# Patient Record
Sex: Female | Born: 1965 | Hispanic: Yes | Marital: Single | Smoking: Never smoker
Health system: Southern US, Community
[De-identification: ages and names within clinical notes are randomized; demographics above are authoritative.]

## PROBLEM LIST (undated history)

## (undated) DIAGNOSIS — IMO0002 Reserved for concepts with insufficient information to code with codable children: Secondary | ICD-10-CM

## (undated) DIAGNOSIS — N289 Disorder of kidney and ureter, unspecified: Secondary | ICD-10-CM

## (undated) DIAGNOSIS — N938 Other specified abnormal uterine and vaginal bleeding: Secondary | ICD-10-CM

## (undated) HISTORY — DX: Reserved for concepts with insufficient information to code with codable children: IMO0002

## (undated) HISTORY — PX: ENDOMETRIAL ABLATION: SHX621

## (undated) HISTORY — DX: Other specified abnormal uterine and vaginal bleeding: N93.8

---

## 2006-03-22 ENCOUNTER — Ambulatory Visit: Payer: Self-pay | Admitting: Family Medicine

## 2006-03-22 ENCOUNTER — Ambulatory Visit: Payer: Self-pay | Admitting: *Deleted

## 2006-03-25 ENCOUNTER — Ambulatory Visit (HOSPITAL_COMMUNITY): Admission: RE | Admit: 2006-03-25 | Discharge: 2006-03-25 | Payer: Self-pay | Admitting: Internal Medicine

## 2006-03-29 ENCOUNTER — Ambulatory Visit: Payer: Self-pay | Admitting: Family Medicine

## 2006-04-04 ENCOUNTER — Ambulatory Visit: Payer: Self-pay | Admitting: Family Medicine

## 2006-04-25 ENCOUNTER — Ambulatory Visit: Payer: Self-pay | Admitting: Family Medicine

## 2006-05-16 ENCOUNTER — Ambulatory Visit: Payer: Self-pay | Admitting: Family Medicine

## 2006-06-17 ENCOUNTER — Ambulatory Visit: Payer: Self-pay | Admitting: Family Medicine

## 2006-08-19 ENCOUNTER — Encounter (INDEPENDENT_AMBULATORY_CARE_PROVIDER_SITE_OTHER): Payer: Self-pay | Admitting: Specialist

## 2006-08-19 ENCOUNTER — Ambulatory Visit: Payer: Self-pay | Admitting: Family Medicine

## 2006-10-26 ENCOUNTER — Ambulatory Visit: Payer: Self-pay | Admitting: Obstetrics and Gynecology

## 2006-12-05 ENCOUNTER — Ambulatory Visit: Payer: Self-pay | Admitting: Family Medicine

## 2007-03-06 ENCOUNTER — Ambulatory Visit: Payer: Self-pay | Admitting: Family Medicine

## 2007-03-14 ENCOUNTER — Ambulatory Visit (HOSPITAL_COMMUNITY): Admission: RE | Admit: 2007-03-14 | Discharge: 2007-03-14 | Payer: Self-pay | Admitting: Internal Medicine

## 2007-03-27 ENCOUNTER — Ambulatory Visit: Payer: Self-pay | Admitting: Family Medicine

## 2007-05-15 ENCOUNTER — Ambulatory Visit: Payer: Self-pay | Admitting: Family Medicine

## 2007-07-17 ENCOUNTER — Ambulatory Visit: Payer: Self-pay | Admitting: Internal Medicine

## 2007-08-09 ENCOUNTER — Encounter (INDEPENDENT_AMBULATORY_CARE_PROVIDER_SITE_OTHER): Payer: Self-pay | Admitting: *Deleted

## 2007-10-23 ENCOUNTER — Ambulatory Visit: Payer: Self-pay | Admitting: Internal Medicine

## 2007-10-23 ENCOUNTER — Encounter (INDEPENDENT_AMBULATORY_CARE_PROVIDER_SITE_OTHER): Payer: Self-pay | Admitting: Family Medicine

## 2007-10-23 LAB — CONVERTED CEMR LAB
Albumin: 3.6 g/dL (ref 3.5–5.2)
Alkaline Phosphatase: 92 units/L (ref 39–117)
BUN: 13 mg/dL (ref 6–23)
CO2: 26 meq/L (ref 19–32)
Calcium: 8.9 mg/dL (ref 8.4–10.5)
Chloride: 104 meq/L (ref 96–112)
Eosinophils Absolute: 0.1 10*3/uL — ABNORMAL LOW (ref 0.2–0.7)
Glucose, Bld: 106 mg/dL — ABNORMAL HIGH (ref 70–99)
HDL: 67 mg/dL (ref >39)
LDL Cholesterol: 103 mg/dL — ABNORMAL HIGH (ref 0–99)
Lymphocytes Relative: 18 % (ref 12–46)
Lymphs Abs: 2.2 10*3/uL (ref 0.7–4.0)
MCV: 83.8 fL (ref 78.0–100.0)
Monocytes Relative: 6 % (ref 3–12)
Neutro Abs: 9.6 10*3/uL — ABNORMAL HIGH (ref 1.7–7.7)
Neutrophils Relative %: 76 % (ref 43–77)
Platelets: 730 10*3/uL — ABNORMAL HIGH (ref 150–400)
Potassium: 4.3 meq/L (ref 3.5–5.3)
RBC: 4.27 M/uL (ref 3.87–5.11)
Sodium: 141 meq/L (ref 135–145)
Total Protein: 7.7 g/dL (ref 6.0–8.3)
Triglycerides: 160 mg/dL — ABNORMAL HIGH (ref <150)
WBC: 12.7 10*3/uL — ABNORMAL HIGH (ref 4.0–10.5)

## 2007-11-01 ENCOUNTER — Inpatient Hospital Stay (HOSPITAL_COMMUNITY): Admission: AD | Admit: 2007-11-01 | Discharge: 2007-11-01 | Payer: Self-pay | Admitting: Obstetrics & Gynecology

## 2007-11-23 ENCOUNTER — Inpatient Hospital Stay (HOSPITAL_COMMUNITY): Admission: AD | Admit: 2007-11-23 | Discharge: 2007-11-23 | Payer: Self-pay | Admitting: Obstetrics & Gynecology

## 2008-02-02 ENCOUNTER — Encounter (INDEPENDENT_AMBULATORY_CARE_PROVIDER_SITE_OTHER): Payer: Self-pay | Admitting: Family Medicine

## 2008-02-02 ENCOUNTER — Ambulatory Visit: Payer: Self-pay | Admitting: Internal Medicine

## 2008-02-02 LAB — CONVERTED CEMR LAB
Basophils Absolute: 0 10*3/uL (ref 0.0–0.1)
Hemoglobin: 10.8 g/dL — ABNORMAL LOW (ref 12.0–15.0)
Lymphocytes Relative: 24 % (ref 12–46)
Lymphs Abs: 2 10*3/uL (ref 0.7–4.0)
Monocytes Absolute: 0.4 10*3/uL (ref 0.1–1.0)
Neutro Abs: 6 10*3/uL (ref 1.7–7.7)
Platelets: 677 10*3/uL — ABNORMAL HIGH (ref 150–400)
RDW: 18.7 % — ABNORMAL HIGH (ref 11.5–15.5)
Saturation Ratios: 13 % — ABNORMAL LOW (ref 20–55)
TIBC: 367 ug/dL (ref 250–470)
WBC: 8.5 10*3/uL (ref 4.0–10.5)

## 2008-09-02 ENCOUNTER — Ambulatory Visit: Payer: Self-pay | Admitting: Internal Medicine

## 2008-10-01 ENCOUNTER — Ambulatory Visit: Payer: Self-pay | Admitting: Internal Medicine

## 2008-10-01 ENCOUNTER — Encounter: Payer: Self-pay | Admitting: Family Medicine

## 2008-10-01 LAB — CONVERTED CEMR LAB
ALT: 16 units/L (ref 0–35)
BUN: 8 mg/dL (ref 6–23)
CO2: 24 meq/L (ref 19–32)
Calcium: 9.1 mg/dL (ref 8.4–10.5)
Chloride: 106 meq/L (ref 96–112)
Cholesterol: 190 mg/dL (ref 0–200)
Creatinine, Ser: 0.47 mg/dL (ref 0.40–1.20)
Eosinophils Relative: 0 % (ref 0–5)
Glucose, Bld: 73 mg/dL (ref 70–99)
HCT: 32 % — ABNORMAL LOW (ref 36.0–46.0)
HDL: 62 mg/dL (ref >39)
Hemoglobin: 9.3 g/dL — ABNORMAL LOW (ref 12.0–15.0)
Lymphocytes Relative: 29 % (ref 12–46)
Lymphs Abs: 2.5 10*3/uL (ref 0.7–4.0)
MCV: 78.2 fL (ref 78.0–100.0)
Monocytes Absolute: 0.6 10*3/uL (ref 0.1–1.0)
Monocytes Relative: 7 % (ref 3–12)
Total CHOL/HDL Ratio: 3.1
Triglycerides: 92 mg/dL (ref <150)
WBC: 8.6 10*3/uL (ref 4.0–10.5)

## 2008-10-21 ENCOUNTER — Encounter: Payer: Self-pay | Admitting: Family Medicine

## 2008-10-21 ENCOUNTER — Ambulatory Visit: Payer: Self-pay | Admitting: Family Medicine

## 2008-10-21 LAB — CONVERTED CEMR LAB: GC Probe Amp, Genital: NEGATIVE

## 2008-10-24 ENCOUNTER — Ambulatory Visit: Payer: Self-pay | Admitting: Internal Medicine

## 2008-10-25 ENCOUNTER — Inpatient Hospital Stay (HOSPITAL_COMMUNITY): Admission: EM | Admit: 2008-10-25 | Discharge: 2008-10-29 | Payer: Self-pay | Admitting: *Deleted

## 2008-10-25 ENCOUNTER — Ambulatory Visit: Payer: Self-pay | Admitting: Infectious Diseases

## 2008-12-02 ENCOUNTER — Ambulatory Visit: Payer: Self-pay | Admitting: Internal Medicine

## 2008-12-22 ENCOUNTER — Observation Stay (HOSPITAL_COMMUNITY): Admission: EM | Admit: 2008-12-22 | Discharge: 2008-12-24 | Payer: Self-pay | Admitting: Internal Medicine

## 2008-12-22 ENCOUNTER — Ambulatory Visit: Payer: Self-pay | Admitting: Cardiology

## 2008-12-22 ENCOUNTER — Encounter (INDEPENDENT_AMBULATORY_CARE_PROVIDER_SITE_OTHER): Payer: Self-pay | Admitting: Internal Medicine

## 2008-12-23 ENCOUNTER — Encounter (INDEPENDENT_AMBULATORY_CARE_PROVIDER_SITE_OTHER): Payer: Self-pay | Admitting: Internal Medicine

## 2009-01-02 ENCOUNTER — Other Ambulatory Visit: Admission: RE | Admit: 2009-01-02 | Discharge: 2009-01-02 | Payer: Self-pay | Admitting: Obstetrics & Gynecology

## 2009-01-02 ENCOUNTER — Ambulatory Visit: Payer: Self-pay | Admitting: Internal Medicine

## 2009-01-02 ENCOUNTER — Ambulatory Visit: Payer: Self-pay | Admitting: Obstetrics & Gynecology

## 2009-01-02 ENCOUNTER — Encounter: Payer: Self-pay | Admitting: Family Medicine

## 2009-01-02 LAB — CONVERTED CEMR LAB
AST: 9 units/L (ref 0–37)
BUN: 8 mg/dL (ref 6–23)
Basophils Absolute: 0 10*3/uL (ref 0.0–0.1)
CO2: 24 meq/L (ref 19–32)
CRP: 8.1 mg/dL — ABNORMAL HIGH (ref <0.6)
Calcium: 9.3 mg/dL (ref 8.4–10.5)
Chloride: 103 meq/L (ref 96–112)
Creatinine, Ser: 0.46 mg/dL (ref 0.40–1.20)
Eosinophils Relative: 2 % (ref 0–5)
HCT: 36.8 % (ref 36.0–46.0)
Hemoglobin: 11.4 g/dL — ABNORMAL LOW (ref 12.0–15.0)
Lymphocytes Relative: 28 % (ref 12–46)
Monocytes Absolute: 0.7 10*3/uL (ref 0.1–1.0)
RDW: 21.5 % — ABNORMAL HIGH (ref 11.5–15.5)

## 2009-01-13 ENCOUNTER — Ambulatory Visit: Payer: Self-pay | Admitting: Internal Medicine

## 2009-01-15 ENCOUNTER — Ambulatory Visit: Payer: Self-pay | Admitting: Obstetrics & Gynecology

## 2009-01-21 ENCOUNTER — Ambulatory Visit (HOSPITAL_COMMUNITY): Admission: RE | Admit: 2009-01-21 | Discharge: 2009-01-21 | Payer: Self-pay | Admitting: Family Medicine

## 2009-01-30 ENCOUNTER — Encounter: Payer: Self-pay | Admitting: Obstetrics & Gynecology

## 2009-01-30 ENCOUNTER — Ambulatory Visit: Payer: Self-pay | Admitting: Family Medicine

## 2009-01-30 LAB — CONVERTED CEMR LAB: GC Probe Amp, Urine: NEGATIVE

## 2009-03-07 ENCOUNTER — Ambulatory Visit: Payer: Self-pay | Admitting: Obstetrics & Gynecology

## 2009-03-07 LAB — CONVERTED CEMR LAB
Platelets: 605 10*3/uL — ABNORMAL HIGH (ref 150–400)
WBC: 15.2 10*3/uL — ABNORMAL HIGH (ref 4.0–10.5)

## 2009-03-20 ENCOUNTER — Ambulatory Visit (HOSPITAL_COMMUNITY): Admission: RE | Admit: 2009-03-20 | Discharge: 2009-03-20 | Payer: Self-pay | Admitting: Obstetrics & Gynecology

## 2009-03-21 ENCOUNTER — Encounter: Admission: RE | Admit: 2009-03-21 | Discharge: 2009-03-21 | Payer: Self-pay | Admitting: Family Medicine

## 2009-03-31 ENCOUNTER — Ambulatory Visit: Payer: Self-pay | Admitting: Internal Medicine

## 2009-03-31 ENCOUNTER — Encounter: Payer: Self-pay | Admitting: Internal Medicine

## 2009-03-31 DIAGNOSIS — M069 Rheumatoid arthritis, unspecified: Secondary | ICD-10-CM | POA: Insufficient documentation

## 2009-03-31 DIAGNOSIS — D649 Anemia, unspecified: Secondary | ICD-10-CM | POA: Insufficient documentation

## 2009-03-31 DIAGNOSIS — B029 Zoster without complications: Secondary | ICD-10-CM | POA: Insufficient documentation

## 2009-04-07 ENCOUNTER — Ambulatory Visit: Payer: Self-pay | Admitting: Internal Medicine

## 2009-05-06 ENCOUNTER — Ambulatory Visit: Payer: Self-pay | Admitting: Internal Medicine

## 2009-07-14 ENCOUNTER — Ambulatory Visit: Payer: Self-pay | Admitting: Internal Medicine

## 2009-08-04 ENCOUNTER — Ambulatory Visit: Payer: Self-pay | Admitting: Internal Medicine

## 2009-08-04 ENCOUNTER — Encounter: Payer: Self-pay | Admitting: Family Medicine

## 2009-08-04 LAB — CONVERTED CEMR LAB
AST: 12 units/L (ref 0–37)
Albumin: 3.3 g/dL — ABNORMAL LOW (ref 3.5–5.2)
BUN: 11 mg/dL (ref 6–23)
Calcium: 8.8 mg/dL (ref 8.4–10.5)
Chloride: 104 meq/L (ref 96–112)
Eosinophils Relative: 1 % (ref 0–5)
Glucose, Bld: 67 mg/dL — ABNORMAL LOW (ref 70–99)
HCT: 39.2 % (ref 36.0–46.0)
Hemoglobin: 11.6 g/dL — ABNORMAL LOW (ref 12.0–15.0)
Lymphocytes Relative: 16 % (ref 12–46)
Lymphs Abs: 2.5 10*3/uL (ref 0.7–4.0)
Monocytes Absolute: 0.9 10*3/uL (ref 0.1–1.0)
Potassium: 4.6 meq/L (ref 3.5–5.3)
RBC: 4.36 M/uL (ref 3.87–5.11)
Total Protein: 7.2 g/dL (ref 6.0–8.3)
WBC: 16.3 10*3/uL — ABNORMAL HIGH (ref 4.0–10.5)

## 2009-08-07 ENCOUNTER — Ambulatory Visit: Payer: Self-pay | Admitting: Obstetrics and Gynecology

## 2009-08-07 ENCOUNTER — Encounter: Payer: Self-pay | Admitting: Obstetrics and Gynecology

## 2009-08-11 ENCOUNTER — Ambulatory Visit: Payer: Self-pay | Admitting: Internal Medicine

## 2009-08-26 ENCOUNTER — Encounter: Admission: RE | Admit: 2009-08-26 | Discharge: 2009-08-26 | Payer: Self-pay | Admitting: Obstetrics & Gynecology

## 2009-10-24 ENCOUNTER — Ambulatory Visit: Payer: Self-pay | Admitting: Internal Medicine

## 2009-12-19 IMAGING — CR DG CHEST 2V
2 series · 2 of 2 positions shown · non-contrast
Comparison: None

CLINICAL DATA: Fever.

CHEST - 2 VIEW

[w chest pa]
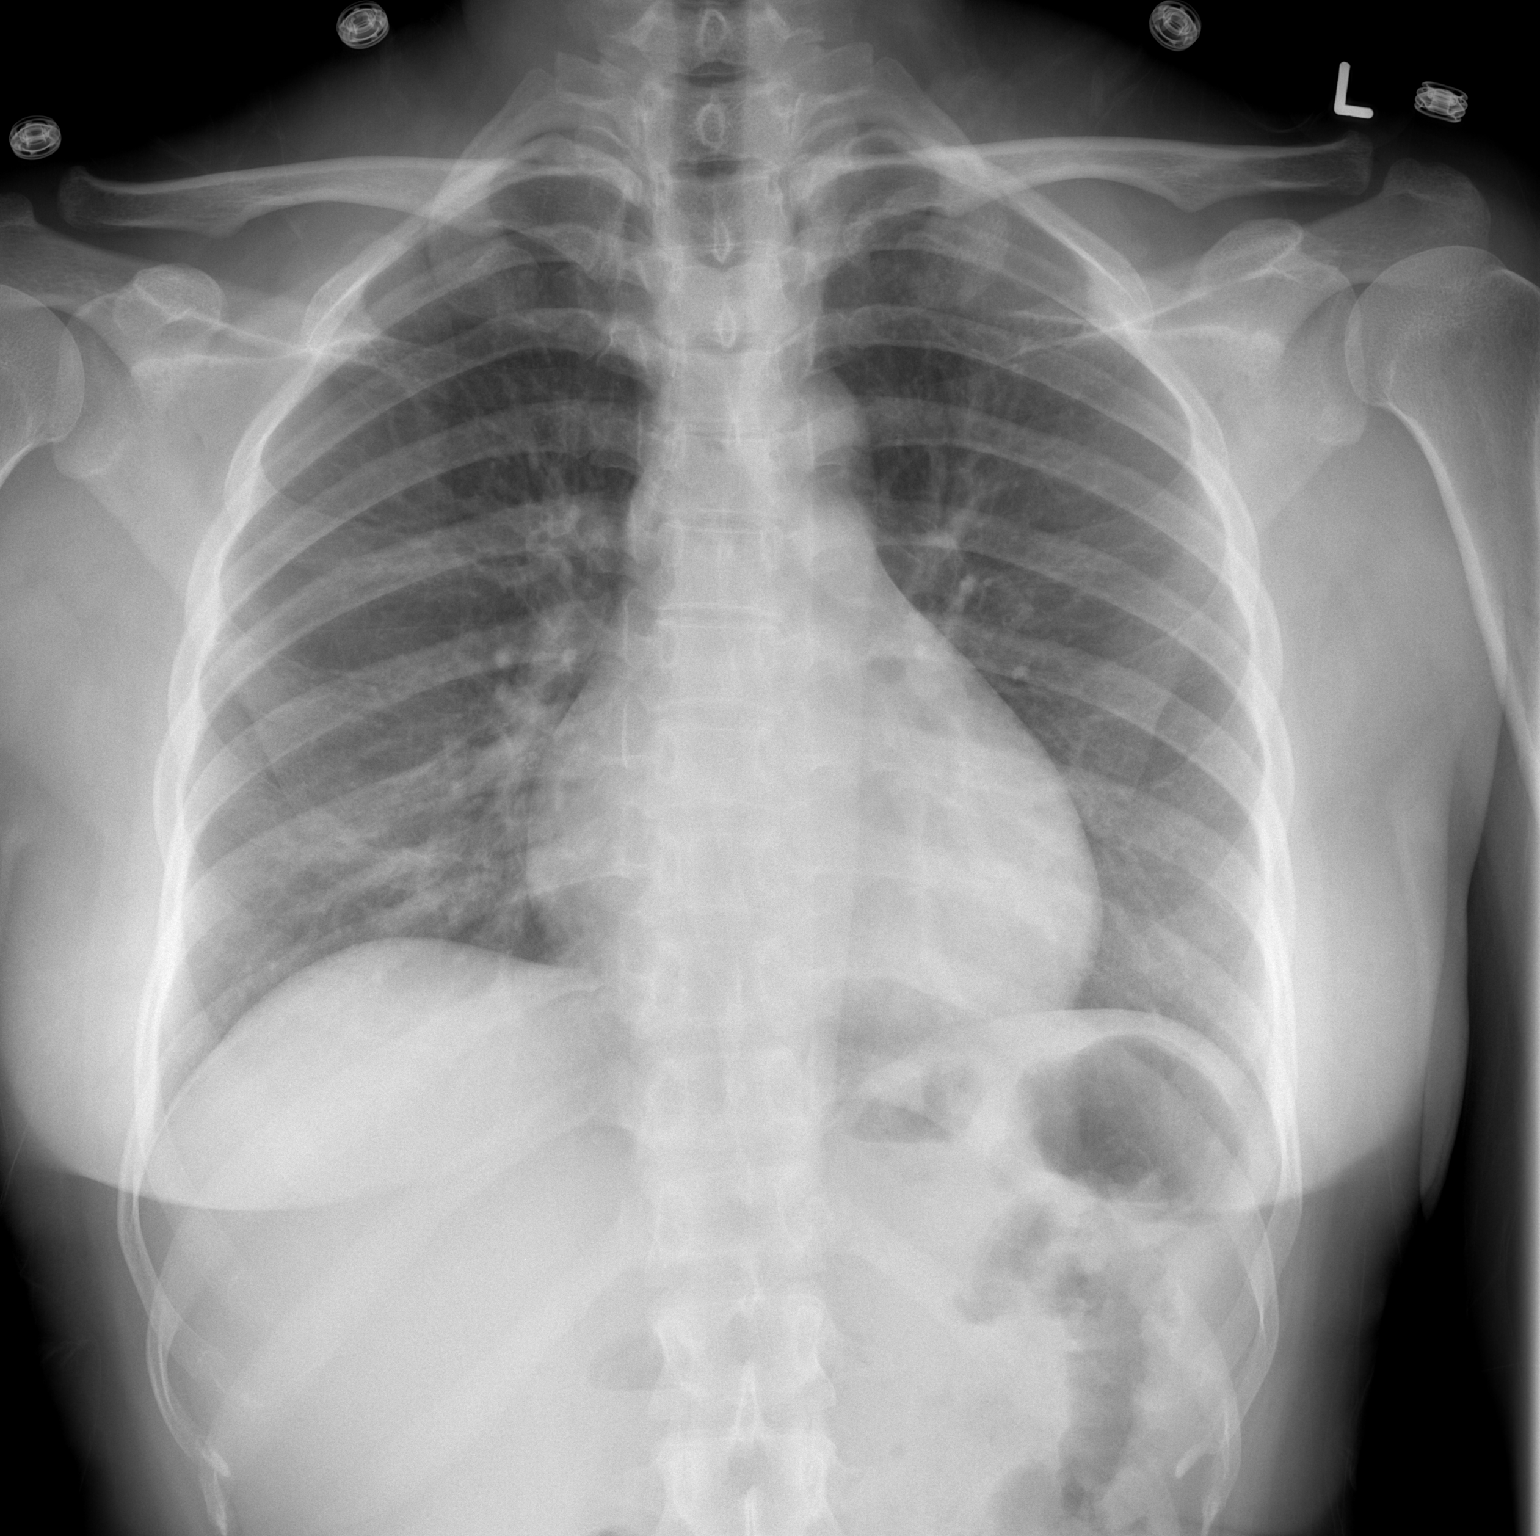

[w chest lat]
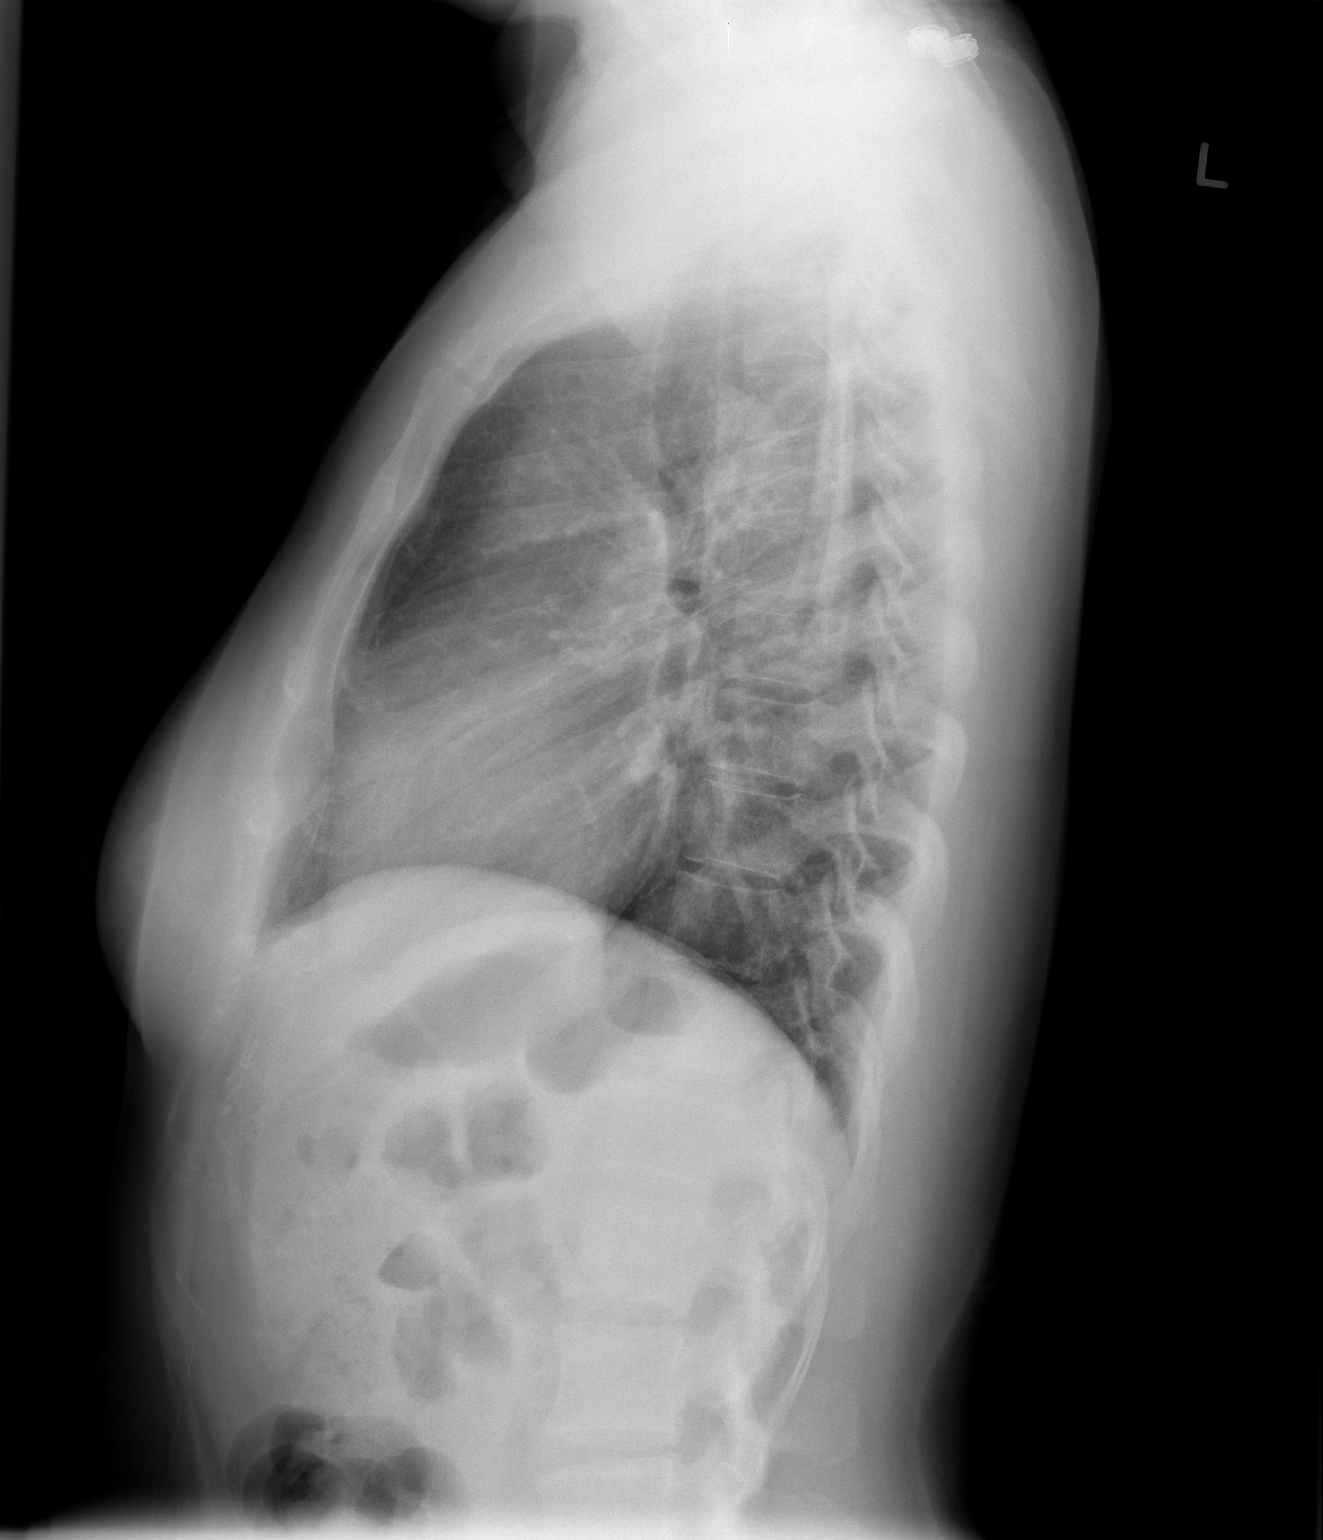

[2 of 2 positions shown; findings below may reference images not displayed]

FINDINGS: Midline trachea. Heart size upper limits of normal and
accentuated by mildly low lung volumes.  Otherwise normal
mediastinal contours. No pleural effusion or pneumothorax. Vague
increased density over the lung bases greater on the right than
left felt to be due to overlying soft tissues.  Lungs otherwise
clear.
IMPRESSION: No acute cardiopulmonary disease.

Borderline cardiomegaly accentuated by low lung volumes.

## 2010-01-01 ENCOUNTER — Ambulatory Visit: Payer: Self-pay | Admitting: Obstetrics and Gynecology

## 2010-01-01 ENCOUNTER — Other Ambulatory Visit: Admission: RE | Admit: 2010-01-01 | Discharge: 2010-01-01 | Payer: Self-pay | Admitting: Obstetrics & Gynecology

## 2010-01-01 ENCOUNTER — Encounter: Payer: Self-pay | Admitting: Obstetrics & Gynecology

## 2010-01-01 LAB — CONVERTED CEMR LAB
MCHC: 30.4 g/dL (ref 30.0–36.0)
Platelets: 763 10*3/uL — ABNORMAL HIGH (ref 150–400)
RBC: 3.74 M/uL — ABNORMAL LOW (ref 3.87–5.11)

## 2010-01-15 ENCOUNTER — Ambulatory Visit: Payer: Self-pay | Admitting: Obstetrics and Gynecology

## 2010-01-23 ENCOUNTER — Ambulatory Visit: Payer: Self-pay | Admitting: Internal Medicine

## 2010-02-23 ENCOUNTER — Ambulatory Visit: Payer: Self-pay | Admitting: Internal Medicine

## 2010-02-23 LAB — CONVERTED CEMR LAB
Basophils Absolute: 0 10*3/uL (ref 0.0–0.1)
Basophils Relative: 0 % (ref 0–1)
Eosinophils Absolute: 0.1 10*3/uL (ref 0.0–0.7)
Eosinophils Relative: 1 % (ref 0–5)
Iron: 39 ug/dL — ABNORMAL LOW (ref 42–145)
Lymphs Abs: 3.2 10*3/uL (ref 0.7–4.0)
MCHC: 30.7 g/dL (ref 30.0–36.0)
MCV: 92.5 fL (ref 78.0–100.0)
Neutrophils Relative %: 69 % (ref 43–77)
Platelets: 618 10*3/uL — ABNORMAL HIGH (ref 150–400)
RDW: 15 % (ref 11.5–15.5)
UIBC: 373 ug/dL
WBC: 12.9 10*3/uL — ABNORMAL HIGH (ref 4.0–10.5)

## 2010-03-16 ENCOUNTER — Ambulatory Visit: Payer: Self-pay | Admitting: Internal Medicine

## 2010-03-31 ENCOUNTER — Ambulatory Visit: Payer: Self-pay | Admitting: Internal Medicine

## 2010-04-27 ENCOUNTER — Ambulatory Visit: Payer: Self-pay | Admitting: Internal Medicine

## 2010-04-27 LAB — CONVERTED CEMR LAB: CRP: 1 mg/dL — ABNORMAL HIGH (ref <0.6)

## 2010-04-28 ENCOUNTER — Telehealth (INDEPENDENT_AMBULATORY_CARE_PROVIDER_SITE_OTHER): Payer: Self-pay | Admitting: *Deleted

## 2010-04-28 ENCOUNTER — Ambulatory Visit: Payer: Self-pay | Admitting: Internal Medicine

## 2010-04-30 ENCOUNTER — Encounter: Admission: RE | Admit: 2010-04-30 | Discharge: 2010-04-30 | Payer: Self-pay | Admitting: Internal Medicine

## 2010-05-01 ENCOUNTER — Ambulatory Visit: Payer: Self-pay | Admitting: Internal Medicine

## 2010-05-01 LAB — CONVERTED CEMR LAB
ALT: 13 units/L (ref 0–35)
AST: 13 units/L (ref 0–37)
Albumin: 3.3 g/dL — ABNORMAL LOW (ref 3.5–5.2)
BUN: 12 mg/dL (ref 6–23)
CO2: 25 meq/L (ref 19–32)
Calcium: 8.7 mg/dL (ref 8.4–10.5)
Chloride: 107 meq/L (ref 96–112)
Creatinine, Ser: 0.57 mg/dL (ref 0.40–1.20)
Eosinophils Absolute: 0.1 10*3/uL (ref 0.0–0.7)
Eosinophils Relative: 1 % (ref 0–5)
HCT: 35.1 % — ABNORMAL LOW (ref 36.0–46.0)
Hemoglobin: 10.2 g/dL — ABNORMAL LOW (ref 12.0–15.0)
Lymphocytes Relative: 27 % (ref 12–46)
Lymphs Abs: 3 10*3/uL (ref 0.7–4.0)
MCV: 91.9 fL (ref 78.0–100.0)
Monocytes Relative: 6 % (ref 3–12)
Potassium: 4.5 meq/L (ref 3.5–5.3)
RBC: 3.82 M/uL — ABNORMAL LOW (ref 3.87–5.11)
WBC: 10.9 10*3/uL — ABNORMAL HIGH (ref 4.0–10.5)

## 2010-05-06 ENCOUNTER — Ambulatory Visit: Payer: Self-pay | Admitting: Internal Medicine

## 2010-05-06 LAB — CONVERTED CEMR LAB
ALT: 17 units/L (ref 0–35)
AST: 22 units/L (ref 0–37)
Alkaline Phosphatase: 104 units/L (ref 39–117)
Anti Nuclear Antibody(ANA): NEGATIVE
BUN: 7 mg/dL (ref 6–23)
Chloride: 103 meq/L (ref 96–112)
Creatinine, Ser: 0.55 mg/dL (ref 0.40–1.20)
Sed Rate: 94 mm/hr — ABNORMAL HIGH (ref 0–22)

## 2010-05-15 ENCOUNTER — Ambulatory Visit: Payer: Self-pay | Admitting: Internal Medicine

## 2010-05-15 LAB — CONVERTED CEMR LAB
CRP: 14.1 mg/dL — ABNORMAL HIGH (ref <0.6)
FSH: 17.1 milliintl units/mL

## 2010-05-19 ENCOUNTER — Ambulatory Visit: Payer: Self-pay | Admitting: Internal Medicine

## 2010-06-08 ENCOUNTER — Ambulatory Visit: Payer: Self-pay | Admitting: Internal Medicine

## 2010-06-08 LAB — CONVERTED CEMR LAB
CRP: 2.8 mg/dL — ABNORMAL HIGH (ref <0.6)
Rhuematoid fact SerPl-aCnc: <20 intl units/mL (ref 0–20)

## 2010-07-06 ENCOUNTER — Ambulatory Visit: Payer: Self-pay | Admitting: Internal Medicine

## 2010-07-06 LAB — CONVERTED CEMR LAB
Basophils Absolute: 0 10*3/uL (ref 0.0–0.1)
Basophils Relative: 0 % (ref 0–1)
Eosinophils Absolute: 0.1 10*3/uL (ref 0.0–0.7)
Eosinophils Relative: 1 % (ref 0–5)
HCT: 23.4 % — ABNORMAL LOW (ref 36.0–46.0)
Hemoglobin: 6.8 g/dL — CL (ref 12.0–15.0)
Iron: 11 ug/dL — ABNORMAL LOW (ref 42–145)
Lymphocytes Relative: 32 % (ref 12–46)
MCHC: 29.1 g/dL — ABNORMAL LOW (ref 30.0–36.0)
MCV: 81.8 fL (ref 78.0–100.0)
Monocytes Absolute: 0.7 10*3/uL (ref 0.1–1.0)
Platelets: 661 10*3/uL — ABNORMAL HIGH (ref 150–400)
RDW: 15.1 % (ref 11.5–15.5)
Vitamin B-12: 273 pg/mL (ref 211–911)

## 2010-07-07 ENCOUNTER — Ambulatory Visit: Payer: Self-pay | Admitting: Internal Medicine

## 2010-07-09 ENCOUNTER — Ambulatory Visit: Payer: Self-pay | Admitting: Obstetrics and Gynecology

## 2010-07-09 LAB — CONVERTED CEMR LAB: Pap Smear: NEGATIVE

## 2010-08-10 ENCOUNTER — Inpatient Hospital Stay (HOSPITAL_COMMUNITY): Admission: AD | Admit: 2010-08-10 | Discharge: 2010-08-10 | Payer: Self-pay | Admitting: Obstetrics & Gynecology

## 2010-08-10 ENCOUNTER — Ambulatory Visit: Payer: Self-pay | Admitting: Nurse Practitioner

## 2010-08-19 ENCOUNTER — Ambulatory Visit: Payer: Self-pay | Admitting: Obstetrics and Gynecology

## 2010-08-25 ENCOUNTER — Emergency Department (HOSPITAL_COMMUNITY)
Admission: EM | Admit: 2010-08-25 | Discharge: 2010-08-25 | Payer: Self-pay | Source: Home / Self Care | Admitting: Emergency Medicine

## 2010-10-21 ENCOUNTER — Ambulatory Visit (HOSPITAL_COMMUNITY): Admission: RE | Admit: 2010-10-21 | Discharge: 2010-10-21 | Payer: Self-pay | Admitting: Obstetrics and Gynecology

## 2010-11-18 ENCOUNTER — Ambulatory Visit: Payer: Self-pay | Admitting: Obstetrics and Gynecology

## 2010-12-02 ENCOUNTER — Ambulatory Visit
Admission: RE | Admit: 2010-12-02 | Discharge: 2010-12-02 | Payer: Self-pay | Source: Home / Self Care | Attending: Obstetrics and Gynecology | Admitting: Obstetrics and Gynecology

## 2010-12-13 ENCOUNTER — Encounter: Payer: Self-pay | Admitting: *Deleted

## 2010-12-22 NOTE — Progress Notes (Signed)
Summary: acute/bruising  Phone Note Call from Patient   Caller: Patient Reason for Call: Talk to Nurse Summary of Call: Patient was in to see [facility] today...and complaining of bruising at site of blood drawn for labwork. Patient also had purple bruising on outer left upper thigh.Marland KitchenMarland KitchenPatient states she bruises easily..Patient denies any physical abuse...she has a history of anemia..Patient has appointment with Dr. Pamalee Leyden on 05/15/10 and will be addressed then.Marland KitchenMarland KitchenShe denies any blood in stool or bleeding nose or gums.Marland KitchenMarland KitchenPatinet states understanding to call with any worsening s/s.. Initial call taken by: Conchita Paris,  April 28, 2010 2:53 PM

## 2010-12-23 ENCOUNTER — Encounter: Payer: Self-pay | Admitting: Obstetrics and Gynecology

## 2011-02-02 LAB — CBC
HCT: 32.6 % — ABNORMAL LOW (ref 36.0–46.0)
Hemoglobin: 10.2 g/dL — ABNORMAL LOW (ref 12.0–15.0)
MCHC: 31.2 g/dL (ref 30.0–36.0)
MCV: 71.2 fL — ABNORMAL LOW (ref 78.0–100.0)

## 2011-02-04 LAB — URINALYSIS, ROUTINE W REFLEX MICROSCOPIC
Bilirubin Urine: NEGATIVE
Glucose, UA: NEGATIVE mg/dL
Ketones, ur: NEGATIVE mg/dL
pH: 5.5 (ref 5.0–8.0)

## 2011-02-04 LAB — DIFFERENTIAL
Basophils Relative: 0 % (ref 0–1)
Eosinophils Relative: 0 % (ref 0–5)
Lymphs Abs: 2 10*3/uL (ref 0.7–4.0)
Monocytes Absolute: 0.7 10*3/uL (ref 0.1–1.0)

## 2011-02-04 LAB — POCT PREGNANCY, URINE
Preg Test, Ur: NEGATIVE
Preg Test, Ur: NEGATIVE

## 2011-02-04 LAB — CBC
Hemoglobin: 7.5 g/dL — ABNORMAL LOW (ref 12.0–15.0)
Hemoglobin: 7.8 g/dL — ABNORMAL LOW (ref 12.0–15.0)
MCH: 20.9 pg — ABNORMAL LOW (ref 26.0–34.0)
MCHC: 28.1 g/dL — ABNORMAL LOW (ref 30.0–36.0)
MCHC: 30.8 g/dL (ref 30.0–36.0)
MCV: 74.4 fL — ABNORMAL LOW (ref 78.0–100.0)
RBC: 3.52 MIL/uL — ABNORMAL LOW (ref 3.87–5.11)
RBC: 3.59 MIL/uL — ABNORMAL LOW (ref 3.87–5.11)
WBC: 29.4 10*3/uL — ABNORMAL HIGH (ref 4.0–10.5)

## 2011-02-04 LAB — BASIC METABOLIC PANEL
CO2: 27 mEq/L (ref 19–32)
Chloride: 103 mEq/L (ref 96–112)
Creatinine, Ser: 0.59 mg/dL (ref 0.4–1.2)
GFR calc Af Amer: >60 mL/min (ref >60)
Glucose, Bld: 101 mg/dL — ABNORMAL HIGH (ref 70–99)

## 2011-02-04 LAB — URINE MICROSCOPIC-ADD ON

## 2011-02-05 LAB — POCT PREGNANCY, URINE: Preg Test, Ur: NEGATIVE

## 2011-03-04 LAB — POCT PREGNANCY, URINE: Preg Test, Ur: NEGATIVE

## 2011-03-08 LAB — URINE MICROSCOPIC-ADD ON

## 2011-03-08 LAB — URINALYSIS, ROUTINE W REFLEX MICROSCOPIC
Glucose, UA: NEGATIVE mg/dL
Hgb urine dipstick: NEGATIVE
Ketones, ur: NEGATIVE mg/dL
Protein, ur: NEGATIVE mg/dL

## 2011-03-08 LAB — BASIC METABOLIC PANEL
CO2: 25 mEq/L (ref 19–32)
Calcium: 9.2 mg/dL (ref 8.4–10.5)
Creatinine, Ser: 0.55 mg/dL (ref 0.4–1.2)
GFR calc Af Amer: >60 mL/min (ref >60)

## 2011-03-08 LAB — DIFFERENTIAL
Basophils Absolute: 0 10*3/uL (ref 0.0–0.1)
Eosinophils Absolute: 0 10*3/uL (ref 0.0–0.7)
Lymphocytes Relative: 10 % — ABNORMAL LOW (ref 12–46)
Neutro Abs: 12.7 10*3/uL — ABNORMAL HIGH (ref 1.7–7.7)

## 2011-03-08 LAB — URINE CULTURE: Colony Count: >=100000

## 2011-03-08 LAB — PREGNANCY, URINE: Preg Test, Ur: NEGATIVE

## 2011-03-08 LAB — TSH: TSH: 0.871 u[IU]/mL (ref 0.350–4.500)

## 2011-03-08 LAB — POCT CARDIAC MARKERS
Myoglobin, poc: 20.9 ng/mL (ref 12–200)
Troponin i, poc: <0.05 ng/mL (ref 0.00–0.09)

## 2011-03-08 LAB — CK TOTAL AND CKMB (NOT AT ARMC)
CK, MB: 0.6 ng/mL (ref 0.3–4.0)
Relative Index: INVALID (ref 0.0–2.5)

## 2011-03-08 LAB — TROPONIN I
Troponin I: 0.01 ng/mL (ref 0.00–0.06)
Troponin I: <0.01 ng/mL (ref 0.00–0.06)
Troponin I: <0.01 ng/mL (ref 0.00–0.06)

## 2011-03-08 LAB — LIPID PANEL
LDL Cholesterol: 106 mg/dL — ABNORMAL HIGH (ref 0–99)
Triglycerides: 45 mg/dL (ref <150)

## 2011-03-08 LAB — CBC
MCHC: 32.1 g/dL (ref 30.0–36.0)
RBC: 4.95 MIL/uL (ref 3.87–5.11)

## 2011-03-09 LAB — BASIC METABOLIC PANEL
BUN: 7 mg/dL (ref 6–23)
Chloride: 103 mEq/L (ref 96–112)
GFR calc Af Amer: >60 mL/min (ref >60)
Potassium: 3.6 mEq/L (ref 3.5–5.1)
Sodium: 138 mEq/L (ref 135–145)

## 2011-03-09 LAB — CBC
HCT: 31.8 % — ABNORMAL LOW (ref 36.0–46.0)
Hemoglobin: 10.3 g/dL — ABNORMAL LOW (ref 12.0–15.0)
Hemoglobin: 10.6 g/dL — ABNORMAL LOW (ref 12.0–15.0)
MCHC: 32.2 g/dL (ref 30.0–36.0)
MCV: 79.4 fL (ref 78.0–100.0)
RBC: 4 MIL/uL (ref 3.87–5.11)
RDW: 23.9 % — ABNORMAL HIGH (ref 11.5–15.5)
WBC: 12.2 10*3/uL — ABNORMAL HIGH (ref 4.0–10.5)

## 2011-03-09 LAB — COMPREHENSIVE METABOLIC PANEL
ALT: 10 U/L (ref 0–35)
Calcium: 8.6 mg/dL (ref 8.4–10.5)
Creatinine, Ser: 0.48 mg/dL (ref 0.4–1.2)
Glucose, Bld: 118 mg/dL — ABNORMAL HIGH (ref 70–99)
Sodium: 138 mEq/L (ref 135–145)
Total Protein: 6.2 g/dL (ref 6.0–8.3)

## 2011-03-09 LAB — CORTISOL: Cortisol, Plasma: 1.2 ug/dL

## 2011-03-27 ENCOUNTER — Inpatient Hospital Stay (HOSPITAL_COMMUNITY)
Admission: AD | Admit: 2011-03-27 | Discharge: 2011-03-27 | Disposition: A | Discharge status: Home / Self Care | Payer: Self-pay | Source: Ambulatory Visit | Attending: Obstetrics & Gynecology | Admitting: Obstetrics & Gynecology

## 2011-03-27 DIAGNOSIS — A499 Bacterial infection, unspecified: Secondary | ICD-10-CM | POA: Insufficient documentation

## 2011-03-27 DIAGNOSIS — N760 Acute vaginitis: Secondary | ICD-10-CM | POA: Insufficient documentation

## 2011-03-27 DIAGNOSIS — R109 Unspecified abdominal pain: Secondary | ICD-10-CM

## 2011-03-27 DIAGNOSIS — B9689 Other specified bacterial agents as the cause of diseases classified elsewhere: Secondary | ICD-10-CM | POA: Insufficient documentation

## 2011-03-27 LAB — CBC
HCT: 36.5 % (ref 36.0–46.0)
MCH: 26.4 pg (ref 26.0–34.0)
MCHC: 31.5 g/dL (ref 30.0–36.0)
MCV: 83.9 fL (ref 78.0–100.0)
RDW: 17.7 % — ABNORMAL HIGH (ref 11.5–15.5)

## 2011-03-27 LAB — URINALYSIS, ROUTINE W REFLEX MICROSCOPIC
Bilirubin Urine: NEGATIVE
Hgb urine dipstick: NEGATIVE
Ketones, ur: NEGATIVE mg/dL
Nitrite: NEGATIVE
Urobilinogen, UA: 0.2 mg/dL (ref 0.0–1.0)

## 2011-03-28 LAB — AMYLASE: Amylase: 63 U/L (ref 0–105)

## 2011-03-28 LAB — LIPASE, BLOOD: Lipase: 30 U/L (ref 11–59)

## 2011-03-28 LAB — COMPREHENSIVE METABOLIC PANEL
ALT: 25 U/L (ref 0–35)
Alkaline Phosphatase: 105 U/L (ref 39–117)
BUN: 18 mg/dL (ref 6–23)
CO2: 24 mEq/L (ref 19–32)
Calcium: 9.3 mg/dL (ref 8.4–10.5)
GFR calc non Af Amer: >60 mL/min (ref >60)
Glucose, Bld: 96 mg/dL (ref 70–99)
Potassium: 3.9 mEq/L (ref 3.5–5.1)
Total Protein: 7.3 g/dL (ref 6.0–8.3)

## 2011-03-28 LAB — WET PREP, GENITAL

## 2011-04-01 ENCOUNTER — Other Ambulatory Visit: Payer: Self-pay | Admitting: Obstetrics and Gynecology

## 2011-04-01 DIAGNOSIS — R102 Pelvic and perineal pain: Secondary | ICD-10-CM

## 2011-04-06 NOTE — H&P (Signed)
NAME:  [name], [name]      ACCOUNT NO.:  [id number]   MEDICAL RECORD NO.:  [id number]          PATIENT TYPE:  EMS   LOCATION:  MAJO                         FACILITY:  [facility]   PHYSICIAN:  Jene Every, M.D.    DATE OF BIRTH:  1966-06-21   DATE OF ADMISSION:  10/24/2008  DATE OF DISCHARGE:                              HISTORY & PHYSICAL   CHIEF COMPLAINT:  Right knee pain.   HISTORY:  This is a 45 year old female, Hispanic, who has been having a  multi-month history of right knee pain and swelling.  She was treated at  [facility].  History is limited as she does not speak [place].  She  though has a son with some English speaking capacity.  She has been  apparently treated for with prednisone, Ultram, Celebrex, and  methotrexate, although does not admit to a definitive diagnosis of  rheumatoid arthritis.  More recently, she developed severe pain, more  swelling, and a low grade fever, presented to the emergency room where  she underwent arthrocentesis of the knee by emergency room physician.  This indicated white blood cells of 58,000.  She has a sedimentation  rate of 130 with a normal white blood cell count.  The Gram stain  however was negative.  She also had an apparent UTI by UA.  The  patient's vital signs, however, were normal.   She had reported also prior to this swelling and pain, some occasional  symptoms.   PAST MEDICAL HISTORY:  Question of rheumatoid arthritis.  Negative for  coronary artery disease, hypertension, or stroke.   MEDICATIONS:  Prednisone Ultram, Celebrex, and methotrexate.   ALLERGIES:  None.   Tobacco negative.  Alcoholic beverages, negative.   PHYSICAL EXAMINATION:  GENERAL:  Healthy female in mild stress.  Mood  and affect is appropriate.  HEENT:  Within normal limits.  HEART:  Regular rate and rhythm.  PULMONARY:  Clear to auscultation.  ABDOMEN:  Soft, nontender.  EXTREMITIES:  Right knee positive effusion, painful with palpation  on  medial lateral joint line and patellofemoral joint, painful range.  Pulse are intact.  Negative DVT.   Radiographs of the knee demonstrate no evidence of fracture or gas in  the subcutaneous tissue.   EKG with normal sinus rhythm.   Chest x-ray within normal limits.   LABORATORY DATA:  White count within normal limits.  Chemistry within  normal limits.   IMPRESSION:  1. Septic arthritis of the right knee, probably hematogenous secondary      to urinary tract infection.  2. Urinary tract infection.  3. Rheumatoid arthritis.  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   PLAN:  1. I discussed arthroscopic I&D for lavage, evaluation of any loose      bodies or meniscal tears.  2. Antibiotic prophylaxis until cultures are available of both the      urine and of the knee fluid.  Also, suspect disseminated GC.  I      have requested from the emergency room physician to have the [place]      [facility] manage medical aspects of her care.  I have  discussed the risks and benefits of the procedure including      bleeding, infection, DVT, anesthetic complications, etc.      Jene Every, M.D.  Electronically Signed     JB/MEDQ  D:  10/25/2008  T:  10/25/2008  Job:  [id number]

## 2011-04-06 NOTE — Consult Note (Signed)
NAME:  [name], [name]      ACCOUNT NO.:  [id number]   MEDICAL RECORD NO.:  [id number]          PATIENT TYPE:  INP   LOCATION:  5041                         FACILITY:  [facility]   PHYSICIAN:  Acey Lav, MD  DATE OF BIRTH:  September 15, 1966   DATE OF CONSULTATION:  10/25/2008  DATE OF DISCHARGE:                                 CONSULTATION   REASON FOR CONSULTATION:  Septic arthritis in a patient with rheumatoid  arthritis.   HISTORY OF PRESENT ILLNESS:  [name] is a 45 year old  Hispanic lady who moved from [place] to the [place] 9 years ago.  In [place] she was diagnosed with arthritis which was called oriasis.  She was given prednisone and methotrexate while in [place].  Since having  been diagnosed with arthritis, which appears to be rheumatoid arthritis,  she has suffered some chronic pain in multiple joints, including her  wrists, elbows, knees and ankles.  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  Her pain in these multiple  joints has flared and she has been intermittently on immunosuppressive  medications, including prednisone and methotrexate.  She has been off  methotrexate itself for several years.  In August of this year she was  taking prednisone at a dose of 6 mg per day and at that time everything  was fine.  This month, knee swelling persisted as it always had but she  started having more and more pain.  She went to [facility] and was seen  by Dr. Alfonse Ras who prescribed her a steroid taper of prednisone of 60 mg  for 3 days, followed by 50, followed by 40, followed by 30 and then  followed by 10 mg and 5 mg.  She was also given methotrexate to be taken  4 tablets once a week.  Note that the patient never filled these  prescriptions, as she was confused by the large numbers of pills and the  taper.  Instead, she continued to have pain and began to have subjective  fevers and was unable to walk.  She came to the  emergency department  late in the evening of December 3 and underwent aspiration of her joint.  The joint aspirate showed turbid fluid that had 53,030 cells with 85%  neutrophils.  No crystals were seen and Gram stained showed many PMNs,  both mononuclear and PMNs, but no organisms.  Her CBC with differential  showed no elevation in her white blood cell count but did show a  normocytic anemia and elevated platelet count.  Her metabolic panel was  relatively unremarkable.  She also had pyuria but was asymptomatic.  The  patient was started on azithromycin and Rocephin in the emergency  department.  She then underwent joint wash-out by Dr. Shelle Iron and on joint  aspiration fluid Gram stain did not show any organisms.  The patient was  then changed to vancomycin and Zosyn.  We were consulted with internal  medicine to help manage her presumptive septic knee and other medical  problems.   In discussing the patient's review of systems with her, she endorses the  chronic  knee effusion, multiple joint pains and recent subjective fevers  with severe pain and difficulty walking and bearing weight.  Otherwise,  she denies weight loss or weight gain and denies nausea or vomiting,  diarrhea, blood per rectum.   PAST MEDICAL HISTORY:  Patient has apparent rheumatoid arthritis.  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  Other than that she has no  other known medical history.  Of note, regarding her rheumatoid  arthritis, she had had her joint aspirated in the past in [place] and was  told that the effusion was due to rheumatoid arthritis itself.   HOME MEDICATIONS:  1. She is on folic acid 1 mg per day.  2. She was prescribed methotrexate 2.5 mg tablets take 4 tablets once      a week. (not started)  3. She was prescribed tramadol 50 mg taken 1 tablet 3 times a day as      needed. (not taking)  4. She was prescribed  Celebrex 200 mg take 1 tablet daily. (not      taking)  5. She was prescribed Docusate 100 mg daily (this is the only      medication that she currently endorses taking).  6. She was also prescribed prednisone 10 mg take 6 tablets for 3 days,      then take 5 tablets for 3 days, then take 4 tablets for 3 days,      then take 3 tablets for 3 days, then switch to prednisone 5 mg      daily. (did not start due to confusion about these medications)   ALLERGIES:  The patient has no known drug allergies.   CURRENT MEDICATIONS:  The patient was given azithromycin, ceftriaxone,  vancomycin and had been written for Zosyn.  She also has been given  Dilaudid, Toradol and morphine.   SOCIAL HISTORY:  The patient moved from [place] 9 years ago.  She is  unaware of having ever been tested for tuberculosis with a PPD.  She has  a boyfriend with her.  She is sexually active.  She had a Pap smear and  states that she was tested for gonorrhea and Chlamydia at [facility]  with a negative GC and Chlamydia amplification test.  She is open to  being tested for HIV here.  She denies smoking, denies alcohol, denies  any intravenous drug use.   FAMILY HISTORY:  She has 2 sisters who have hypertension and 1 which has  diabetes.   REVIEW OF SYSTEMS:  As noted in HPI.  Otherwise, 10-point review of  systems is negative.   PHYSICAL EXAMINATION:  Temperature maximum during stay is 100.3.  Her  blood pressure when I examined her was 115/53 with a pulse of 79.  She  was satting 99% on room air.  She was breathing 22 times a minutes.  GENERAL:  Quite pleasant lady in no acute distress.  HEENT:  Normocephalic, atraumatic.  Pupils were equal, round and  reactive to light.  Sclerae anicteric.  Oropharynx clear without exudate  or lesions.  NECK:  Supple.  CARDIOVASCULAR:  Revealed a regular rate and rhythm without murmurs,  gallops or rubs.  LUNGS:  Clear to auscultation bilaterally without wheezing, rhonchi or   rales.  ABDOMEN:  Soft, nondistended, nontender, without hepatosplenomegaly.  MUSCULOSKELETAL:  Examination of her elbows, wrists did not disclose any  tenderness.  She did have some  PIP prominence bilaterally.  She states  that she usually has more pain in her right metacarpophalangeal joint  than left, but she has diffuse pain normally, although at present she  denies any pain, which she attributes to the fact that she is on  Dilaudid and pain medicines.  Her right knee is wrapped with a drain in  place with serosanguineous fluid draining from it.  The left knee is  nontender, with a slight effusion. Right ankle has an effusion and slght  tenderness with palpation left ankle  without effusion and without  tenderness and MTP joints are nontender and not inflamed.   LABORATORY DATA:  Two-view of the chest showed no acute pulmonary  disease.  Knee films:  Right knee showed large suprapatellar joint  effusion, no evidence of osteomyelitis.  Review of old films of the hand  from [facility] showed no evidence for rheumatoid arthritis on films  from April 2008.  Ankle films from April 2008 showed no soft-tissue  swelling and no osseous abnormalities.  Knee films bilaterally from May  of 2007 shows bilateral knee joint effusions which are large, left  greater than right, at that time and degenerative joint changes, most  notably in the medial compartments, worse on the right.   Further laboratory data:  Cell count differential from the joint  aspiration showed 53,030 cells with 85% neutrophils, 15% monocytes, no  crystals seen.  Gram stain showed multiple white cells, both PMNs and  mononuclear, but no cells.  CBC with differential showed a white count  of 7800, hemoglobin of 9.3, hematocrit of 29.6, MCV of 72.1, platelets  of 721, neutrophil count of 5.4, lymphocytes 1.7, monocytes 0.7, no  eosinophils on absolute count.  Basic metabolic panel showed sodium 134,  potassium 3.5, chloride 102,  bicarb 24, glucose 107, creatinine 0.44,  calcium 8.4.  PT was slightly elevated at 15.3 and INR was 1.2.  PTT was  43.  Urine pregnancy was negative.  Urinalysis showed large leukocytes  and positive nitrites.  Microscopic examination showed rare squamous  cells, showed 21-50 white blood cells per high powered field, many  bacteria.  Sedimentation rate was 130.  Repeat Gram stain  intraoperatively showed no organisms but abundant PMNs.  Protein from  the synovial joint fluid was 5.2, glucose was 0.   IMPRESSION/RECOMMENDATIONS:  1. Joint effusion:  The patient's cell count from her right knee of      53,000 can certainly be consistent with a septic joint.  I would      have, however, expected her count to be higher, although it can be      in this range for a septic joint.  Certainly, patients with      rheumatoid arthritis are at high risk for infection, in particular      from Staphylococcus aureus.  Gonorrhea and disseminated gonococcal      infection of the joint should certainly be considered, as should      streptococcal infection.  Given her history, however, of more than      10 years of having had a joint effusion on the right and apparently      per x-ray in bilateral knees, I am skeptical that this is actually      an infected joint.  In particular, her history of coming on and off      immunosuppressive drugs which at times when they were taken      actually controlled her symptoms makes  me suspicious that she does      have rheumatoid arthritis and an inflamed joint due to the      rheumatoid arthritis.  I have discussed this case with Dr. Kellie Simmering      and he will examine the patient as a rheumatology consultant on      Monday.  In the interim, I agree with continuing the patient on      antibiotics with vancomycin to cover MRSA, as well as Rocephin to      cover for streptococcal species more effectively and also cover for      gonococcus.  I will check her urine for  gonococcus and Chlamydia by      amplification.   I will leave her off prednisone at present.  She has not been on  prednisone for weeks and there is no risk for adrenal insufficiency.  I  am also not going to start her on any other immunosuppressive drug, such  as methotrexate.  I would recommend minimizing pain medication in this  patient as able and observing her for flare of rheumatoid arthritis in  other joints besides the knee.  We will follow the cultures.  Should  they prove negative, we will be faced with the dilemma of whether to  declare this an infected joint or possibly an inflamed joint due to  rheumatoid arthritis.  Again, my clinical sense it is more likely an  inflamed joint.  RA and other connective tissue diseases can certainly  cause low-grade fevers and can cause inflamed joint with effusions with  a high white count, but in the interim, I do think it is wise to keep  her on antibiotics.  I will also check a rheumatoid factor, as well as a  CCP.  I will obtain her records from [facility] from Dr. Alfonse Ras.  They  are currently closed.  1. Iron deficiency anemia:  Looking through the x-rays it looks like      she has had transvaginal ultrasounds for heavy menstrual bleeding      which I assume is the cause of her iron deficiency anemia.  I will      continue her on iron.  2. Folic acid deficiency:  I will continue her on folic acid but also      check a level for this.  Again, we need records from Dr. Alfonse Ras.  3. Asymptomatic pyuria:  Patient has absolutely no symptoms of      dysuria.  I do not believe she needs treatment for urinary tract      infection based on her current lack of symptoms to suggest urinary      tract infection.  4. Infectious disease screening:  I am going to check her for HIV and      gonorrhea, as mentioned above.  I would like to place a PPD on her      as this has not been done in the past and she is currently off      immunosuppressive  drugs, so the test would be better.  Tuberculosis      can cause joint effusions, as well, although this is less likely,      though it is certainly possible.  5. Prophylaxis:  The patient will need DVT prophylaxis postoperatively      and should, at minimum, be on 3-times-a-day subcutaneous heparin.   We will follow along on the internal medicine service and Dr. Kellie Simmering  will see the  patient on Monday for rheumatology.   Addendum: My colleague Dr. Ninetta Lights visited the patient later in the day.  Her sister stated that the patient had Psoriatic arthritis, not  Rheumatoid arthritis. They clarified that the patient has had chronic  knee effusions with the  left one having been aspirated multiple times. The patient suffers from  pain in hier wrists, neck, knees and ankles. This pain and the effusions  have been controlled with prednisone and methotrexate in the past.  Apparently the patient had some dermatological phenomena which may or  may not have been c/w psoriasis affecting the skin.      Acey Lav, MD  Electronically Signed     CV/MEDQ  D:  10/25/2008  T:  10/25/2008  Job:  [id number]   cc:   Lacretia Leigh. Ninetta Lights, M.D.  Aundra Dubin, M.D.

## 2011-04-06 NOTE — Group Therapy Note (Signed)
[name], [name]       ACCOUNT NO.:  [id number]   MEDICAL RECORD NO.:  [id number]          PATIENT TYPE:  WOC   LOCATION:  [facility]                   FACILITY:  [facility]   PHYSICIAN:  Scheryl Darter, MD       DATE OF BIRTH:  06-Feb-1966   DATE OF SERVICE:  03/07/2009                                  CLINIC NOTE   CHIEF COMPLAINT:  Bleeding x2 months.   HISTORY OF PRESENT ILLNESS:  The patient is a 45 year old G4, P3 who  presents following colposcopy with biopsies on January 02, 2009, and  cryosurgery on January 30, 2009, with daily bleeding since January 02, 2009.  The patient reports that she has had continuous vaginal bleeding  since colposcopy in February 2010.  Denies any pain with bleeding.  Before she was using 2-3 pads daily, sometimes more.  Unable to  specifically quantify how much blood she is having, says sometimes it is  spotting and in sometimes it quite.  Prior to the procedure in February  2010, the patient reports she had normal menstrual cycles every month  lasting approximately 5-7 days.   PHYSICAL EXAMINATION:  VITAL SIGNS:  Temperature 96.7, heart rate 72,  blood pressure 148/80, weight 151.2 pounds, and respiratory rate 24.  GENERAL:  In no apparent distress, awake and alert.  HEENT:  No conjunctival pallor.  CARDIOVASCULAR:  Regular rate and rhythm.  GU:  Blood noted in vaginal vault.  Blood appears to be coming from  cervical os.  No obvious lesions or abnormalities, otherwise noted.   ASSESSMENT AND PLAN:  The patient is a 45 year old female with vaginal  bleeding.  1. Bleeding:  Although at the given time it seems to correspond to      procedure, it does not seem that this is related to the bleeding      coming from the os and is likely uterine in nature.  Therefore, we      will work up the patient for dysfunctional uterine bleeding.  We      will obtain pelvic ultrasound to evaluate anatomy and the patient      will return in 2-3 weeks to  discuss results.  We will also check      CBC if the patient has had bleeding before 2 months.  We will also      give a trial of 5 mg of Provera daily x 20 days to see if we can      get bleeding stopped.     ______________________________  Lequita Asal, MD    ______________________________  Scheryl Darter, MD    TB/MEDQ  D:  03/07/2009  T:  03/08/2009  Job:  [id number]

## 2011-04-06 NOTE — Op Note (Signed)
NAME:  [name], [name]      ACCOUNT NO.:  [id number]   MEDICAL RECORD NO.:  [id number]          PATIENT TYPE:  INP   LOCATION:  2550                         FACILITY:  [facility]   PHYSICIAN:  Jene Every, M.D.    DATE OF BIRTH:  23-Aug-1966   DATE OF PROCEDURE:  10/25/2008  DATE OF DISCHARGE:                               OPERATIVE REPORT   PREOPERATIVE DIAGNOSES:  1. Septic arthritis.  2. Osteoarthritis.  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   POSTOPERATIVE DIAGNOSES:  1. Septic arthritis.  2. Osteoarthritis.  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   PROCEDURE PERFORMED:  1. Right knee arthroscopy.  2. Extensive synovectomy.  3. Partial medial meniscectomy.   ANESTHESIA:  General.   BRIEF HISTORY:  The patient is a 45 year old with septic arthritis of  the right knee, has a history of rheumatoid arthritis, was seen in the  emergency room positive aspiration for 58,000 white cell.  She had an  elevated temperature, elevated sedimentation rate.  Though the Gram  stain was negative, severe pain, and effusion, she was indicated for a  presumed septic arthritis.  She also had some mechanical symptoms prior  to that, possible osteoarthritis and degenerative meniscal tear.  Risks  and benefits discussed including bleeding, infection, DVT, PE,  anesthetic complications, etc.   TECHNIQUE:  The patient was placed in the supine position.  After  induction of adequate anesthesia, she had received vancomycin and  Zithromax.  Right lower extremity was prepped and draped in the usual  sterile fashion.  A lateral parapatellar portal and superolateral  parapatellar portal was fashioned with a #11 blade.  Ingress cannula was  atraumatically placed.  Copious portions of 0 purulent fluid was  expressed from the knee.  There was a cartilaginous debris noted as  well.  Under direct visualization, the medial parapatellar portal was  fashioned with a #11 blade after  localization with an 18-gauge needle  sparing the medial meniscus.  Hypertrophic synovitis was noted in all 3  compartments as well as degenerative tear and radial flap tear of the  medial meniscus.  Extensive lavage was performed.  Synovectomy was  performed in the suprapatellar pouch, medial and lateral compartment,  and medial and lateral gutter.  Following this, we shaved the medial  meniscus to a stable base.  ACL was unremarkable.  Lateral compartment  revealed normal femoral condyle, tibial plateau, meniscus stable for  palpation as was the remainder of the medial meniscus.  I saw no  significant chondral damage to the medial femoral condyle, tibial  plateau, the patella, or the sulcus.  There was normal patellofemoral  tracking.  We copiously lavaged the knee and 6 L of fluid was lavaged  through the knee and after extensive synovectomy and partial medial  meniscectomy, all instrumentation was removed.  We had placed a large  Hemovac through the lateral parapatellar portal.  The portal was closed  with 4-0 nylon simple sutures.  Wound was dressed sterilely and secured  with an ACE bandage.   The patient was then awoken without difficulty and transported to the  recovery room in satisfactory  condition.   We sent the fluid for stat Gram stain, aerobic, anaerobic cultures.   The patient tolerated the procedure well.  There were no complication.  Blood loss minimal.      Jene Every, M.D.  Electronically Signed     JB/MEDQ  D:  10/25/2008  T:  10/25/2008  Job:  [id number]

## 2011-04-06 NOTE — Discharge Summary (Signed)
[name], [name]       ACCOUNT NO.:  [id number]   MEDICAL RECORD NO.:  [id number]          PATIENT TYPE:  INP   LOCATION:  5041                         FACILITY:  [facility]   PHYSICIAN:  Jene Every, M.D.    DATE OF BIRTH:  1966-09-30   DATE OF ADMISSION:  10/24/2008  DATE OF DISCHARGE:  10/29/2008                               DISCHARGE SUMMARY   ADMISSION DIAGNOSES:  1. Questionable septic arthritis of the right knee.  2. 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   DISCHARGE DIAGNOSES:  1. Questionable septic arthritis of the right knee.  2. 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  3. Status post arthroscopic debridement of right knee.   HISTORY:  The patient is a 45 year old Hispanic female who has a history  of some form of systemic arthritis, question is rheumatoid or psoriatic.  She presents to the emergency room with a large right knee effusion,  aspirate obtained in the emergency room revealed 58,000 white blood  cells with elevated sed rate of 130.  She does have a normal white cell  count, and the Gram stain was negative.  There was a concern at this  time for possible septic arthritis of knee; therefore, Dr. Shelle Iron felt  the patient will need irrigation debridement of the knee.  The risks and  benefits of this were discussed through an interpreter.  The patient is  agreeable to this.   PROCEDURE:  The patient was taken to the operating room on October 25, 2008, underwent right knee arthroscopy with irrigation and debridement  for lavage.   SURGEON:  Jene Every, MD   ASSISTANT:  None.   ANESTHESIA:  General.   COMPLICATIONS:  None.   CONSULTATIONS:  1. PT/OT.  2. Infectious Disease.  3. Internal medicine.   HOSPITAL COURSE:  The patient was evaluated in the ER by Dr. Shelle Iron,  taken to the OR, and underwent the above-stated procedure.  She was then  transferred to the PACU and then to the orthopedic floor for continued  postoperative care.  Postoperatively, the patient did well.  We did get  on consult with Infectious Disease.  There was a question if this was  septic arthritis versus an inflamed joint.  Through an interpreter, we  learnt she had a long history of multiple joint effusions.  She has  previously been on prednisone, as well as methotrexate.  While in house,  she was started on vancomycin.  At admission, labs showed positive UA  but the patient was completely asymptomatic; therefore, Infectious  Disease do not feel the need to treat this with any antibiotics.  Multiple cultures were obtained, all came back negative.  Gram stain was  negative.  They did run gonorrhea and chlamydia cultures as well, which  were negative in this patient.  The patient continued to improve  throughout the hospital course, decreased pain, decreased swelling.  Conclusion at the end of her stay was that this was more likely related  to her systemic arthritis.  There will be an evaluation set up with a  rheumatologist for her to be worked up for this again.  She has  not been  on any consistent medications since she left [place] over 9 years ago.  Again, Infectious Disease cleared the patient to go home without any  antibiotic therapy since all cultures were negative.  The patient was  discharged with pain medication, as well as anti-inflammatory.  She is  to keep her incisions clean, dry, and intact.  P.r.n. ice and elevation.  She is to present back to the emergency room if she develops any fevers  or extremity pain.  Diet is as tolerated.   CONDITION ON DISCHARGE:  Stable.   She is to follow up with Dr. Shelle Iron in approximately 1 week for suture  removal.  Follow up with Rheumatology as advised by Infectious Disease.   FINAL DIAGNOSIS:  Status post irrigation and debridement of right knee.  Cultures were again negative.   DIFFERENTIAL DIAGNOSES:  Septic arthritis versus rheumatologic disorder.      [name], P.A.      Jene Every, M.D.  Electronically Signed    CS/MEDQ  D:  01/15/2009  T:  01/16/2009  Job:  [id number]

## 2011-04-06 NOTE — Discharge Summary (Signed)
NAME:  [name], [name]      ACCOUNT NO.:  [id number]   MEDICAL RECORD NO.:  [id number]          PATIENT TYPE:  OBV   LOCATION:  4705                         FACILITY:  [facility]   PHYSICIAN:  Peggye Pitt, M.D. DATE OF BIRTH:  1966/05/10   DATE OF ADMISSION:  12/22/2008  DATE OF DISCHARGE:  12/24/2008                               DISCHARGE SUMMARY   DISCHARGE DIAGNOSES:  1. Chest pain, ruled out for acute myocardial infarction and pulmonary      embolus, believed to be secondary to gastroesophageal reflux      disease.  2. Urinary tract infection.  3. Iron-deficiency anemia.  4. Psoriatic arthritis.   DISCHARGE MEDICATIONS:  1. Cipro 250 mg twice daily for 5 days.  2. Omeprazole 20 mg daily.  3. Methotrexate 10 mg weekly on Wednesday.  4. Prednisone 15 mg daily.  5. Iron sulfate 325 mg daily.  6. Folic acid 1 mg daily.  7. Nabumetone 500 mg twice daily.   DISPOSITION AND FOLLOW UP:  The patient is discharged home in stable  condition.  She has had no further chest pain since admission.  She will  follow up with her primary care physician at the [facility] on [facility].   CONSULTATIONS THIS HOSPITALIZATION:  None.   IMAGES AND PROCEDURES PERFORMED DURING THIS HOSPITALIZATION:  1. A chest x-ray on December 22, 2008 that shows a minimal bronchitic      changes with borderline cardiac enlargement.  2. She had a CT scan of the head without contrast on December 22, 2008      that showed no acute intracranial abnormalities.  3. A CT angiogram of the chest on December 22, 2008 consistent with no      evidence for pulmonary embolism.  4. She also had a 2-D echocardiogram on December 23, 2008 that showed      an ejection fraction of 55-60% with no left ventricular regional      wall motion abnormalities.Marland Kitchen   HISTORY AND PHYSICAL EXAMINATION:  For full details, please refer to  history and physical dictated by Dr. Orvan Falconer on December 22, 2008 but in  brief, Mrs.  Ginny Dunn is a 45 year old Hispanic woman with a history of  psoriatic arthritis who had a syncopal episode on day of admission.  On further questioning, she really did not pass out.  She just felt real  dizzy and fell to the floor but she did not experience any loss of  consciousness but she did have some chest pain as well as some shortness  of breath.  Denied any palpitations.  For that reason, she was brought  into the hospital for further evaluation and management.   HOSPITAL COURSE:  1. Chest pain.  She has not ruled out for an acute myocardial      infarction with three sets of negative cardiac enzymes as well as      EKGs that demonstrate no acute ischemic changes.  Given her absence      of coronary artery disease risk factors, we believe at this point      that a further cardiac workup is not indicated.  She  also had a CT      angiogram of her chest that ruled her out for an acute PE.  Given      her chest pain characteristics, I believe that it is most likely      GERD as it is worse after having meals and after lying down flat.      For that reason, we will start her on a PPI, omeprazole 20 mg that      she will take daily for at least 12 weeks.  2. For her syncopal episodes etiology remained still unclear.  As      stated above, do not believe these were true syncopies given      absence of loss of consciousness.  However, her 2-D echo has been      normal.  She has not been orthostatic.  Her CT head was normal and      given absence of neurological findings, we did not proceed to an      MRI.  It might have been secondary to acute dehydration secondary      to her urinary tract infection in combination with her chest pain.  3. For her UTI, cultures have grown greater than 100,000 colonies of E-      coli that is pansensitive.  She has been on Rocephin for 2 days      while in the hospital.  We have send her home on Cipro 250 mg twice      daily for 5 more days to complete a  total of 7 days of antibiotic      treatment.  4. For her iron-deficiency anemia, we will continue her ferrous      sulfate.  Her hemoglobin has remained stable in the 10.3-10.6      range.  5.  For her psoriatic arthritis, she has been treated as an      outpatient with prednisone and on methotrexate and this can be      further managed by her primary care physician.   VITAL SIGNS ON DAY OF DISCHARGE:  Blood pressure 110/56, heart rate 65,  respirations 20, O2 sats 99% on room air with a temperature of 97.8.   LABORATORY DATA ON DAY OF DISCHARGE:  Sodium 138, potassium 3.6,  chloride 103, bicarb 24, BUN 7, creatinine 0.54 with a glucose of 101.  WBCs 12.2, hemoglobin 10.3, and platelets of 425.      Peggye Pitt, M.D.  Electronically Signed     EH/MEDQ  D:  12/24/2008  T:  12/24/2008  Job:  [id number]   cc:   Dala Dock

## 2011-04-06 NOTE — H&P (Signed)
NAME:  [name], [name]      ACCOUNT NO.:  [id number]   MEDICAL RECORD NO.:  [id number]          PATIENT TYPE:  OBV   LOCATION:                               FACILITY:  [facility]   PHYSICIAN:  Vania Rea, M.D. DATE OF BIRTH:  1966-06-21   DATE OF ADMISSION:  12/22/2008  DATE OF DISCHARGE:  12/24/2008                              HISTORY & PHYSICAL   PRIMARY CARE PHYSICIAN:  [facility].   CHIEF COMPLAINT:  Syncope and dizziness for the past 2-3 days.   HISTORY OF PRESENT ILLNESS:  This is a 45 year old Hispanic lady with a  history of psoriatic arthritis, who was treated here two months ago, for  septic arthritis, and iron deficiency anemia.  Has been doing well since  then until 2-3 days ago Thursday when she had an episode of syncope  while in her kitchen.  The patient says she was preparing a concoction  which someone had told her was good for anemia, and she thinks the smell  may have affected her.  She started off feeling dizzy and strange, then  passed out and was found by somebody else lying in the kitchen.  She did  not injure herself, but since then, she has had repeated episodes of  dizziness and chest pain and chest tightness associated with the feeling  that she is about to faint and shortness of breath.  Also, accompanied  by nausea.  She has had no palpitations.  She does not have PND.  She  does not have a lower extremity edema.  She has had no similar episodes  prior.  The patient says she has had a tubal ligation.  Menstruation is  regular each month, and her last menstrual period was December 20.  She  is having no fever, no chills, no dysuria or frequency.   PAST MEDICAL HISTORY:  1. Psoriatic arthritis.  2. Depression.  3. Iron deficiency anemia.   MEDICATIONS:  1. Methotrexate 10 mg weekly.  2. Prednisone 15 mg daily.  3. Iron sulfate 325 mg daily.  4. Folic acid 1 mg daily.  5. Nabumetone 500 mg b.i.d.   ALLERGIES:  No known drug allergies.   SOCIAL HISTORY:  No history of tobacco, alcohol or illicit drug use.  She has immigrated from [place] and speaks no [place].   FAMILY HISTORY:  Significant for hypertension and diabetes.  No family  history of heart disease.   REVIEW OF SYSTEMS:  Other than noted above, a 10-point review of systems  is unremarkable.  There is no history of vomiting, diarrhea or bloody or  black stool.   PHYSICAL EXAMINATION:  GENERAL:  A pleasant, well-nourished, Hispanic  lady lying in bed in no acute distress.  VITAL SIGNS:  Temperature 98.0, pulse 91, respirations 20, blood  pressure 134/69.  She is not orthostatic.  She is in no pain.  She is  saturating at 100% on 2 liters.  HEENT:  Pupils are round and equal.  Mucous membranes are pink and  anicteric.  She has no cervical lymphadenopathy or thyromegaly.  No  jugular venous distention.  She does have a loud bruit over both  carotids but appears to be transmitted from the heart.  CHEST:  Clear to auscultation bilaterally.  CARDIOVASCULAR:  Regular rate and rhythm.  She has a 3/6 systolic murmur  best heard in the upper left parasternal area.  ABDOMEN:  Soft, nontender.  No masses.  EXTREMITIES:  Without edema.  She has 2+ dorsalis pedis pulses  bilaterally.  CENTRAL NERVOUS SYSTEM:  Cranial nerves II-XII are grossly intact.  No  focal neurological deficits.   LABORATORY DATA:  She has leukocytes, also white count of 14.6,  hemoglobin 12.4.  MCV, although improved from two months ago is still a  little low at 78.  Platelet count is 584.  Smear is described as  unremarkable.  Serum chemistry is essentially normal, although, glucose  is elevated to 157.  Her cardiac enzymes are completely normal with a  Myoglobin of 15 and undetectable CK MB and troponins.  Urinalysis has  trace of small leukocyte esterase, otherwise, unremarkable.  Urine is  not concentrated.  D. dimer was elevated, and CT angiogram of the chest  was done which showed normal  aorta and no evidence of pulmonary embolus.  CT scan of the brain without contrast showed no acute abnormalities.  A  2-view of the chest showed borderline cardiac enlargement with minimal  bronchitic changes.  Her EKG showed normal sinus rhythm.   ASSESSMENT:  1. Syncope and presyncope of unclear etiology.  2. Systolic murmur, likely aortic stenosis but with good peripheral      perfusion and no evidence of orthostasis.  3. Possible urinary tract infection.  4. Mild iron deficiency.  5. Leukocytosis and thrombocytosis of unclear etiology.  6. History of psoriatic arthritis.  7. Hyperglycemia.   PLAN:  Will bring this lady under observation.  Will hydrate, get a 2D  echo, check her hemoglobin A1C and also do bilateral carotid Dopplers  and other further investigations as indicated by subsequent  developments.      Vania Rea, M.D.  Electronically Signed     LC/MEDQ  D:  12/22/2008  T:  12/22/2008  Job:  [id number]   cc:   Dala Dock

## 2011-04-12 ENCOUNTER — Ambulatory Visit (HOSPITAL_COMMUNITY)
Admission: RE | Admit: 2011-04-12 | Discharge: 2011-04-12 | Disposition: A | Discharge status: Home / Self Care | Payer: Self-pay | Source: Ambulatory Visit | Attending: Obstetrics and Gynecology | Admitting: Obstetrics and Gynecology

## 2011-04-12 DIAGNOSIS — N949 Unspecified condition associated with female genital organs and menstrual cycle: Secondary | ICD-10-CM | POA: Insufficient documentation

## 2011-04-12 DIAGNOSIS — R102 Pelvic and perineal pain: Secondary | ICD-10-CM

## 2011-04-12 DIAGNOSIS — N852 Hypertrophy of uterus: Secondary | ICD-10-CM | POA: Insufficient documentation

## 2011-04-21 ENCOUNTER — Encounter: Payer: Self-pay | Admitting: Family Medicine

## 2011-04-21 ENCOUNTER — Other Ambulatory Visit: Payer: Self-pay | Admitting: Family Medicine

## 2011-04-21 DIAGNOSIS — R1084 Generalized abdominal pain: Secondary | ICD-10-CM

## 2011-04-21 DIAGNOSIS — N6320 Unspecified lump in the left breast, unspecified quadrant: Secondary | ICD-10-CM

## 2011-04-21 DIAGNOSIS — N800 Endometriosis of uterus: Secondary | ICD-10-CM

## 2011-04-22 NOTE — Group Therapy Note (Signed)
NAMELEITHA, [name]       ACCOUNT NO.:  [id number]  MEDICAL RECORD NO.:  [id number]           PATIENT TYPE:  A  LOCATION:  [facility]                   FACILITY:  [facility]  PHYSICIAN:  Lucina Mellow, DO   DATE OF BIRTH:  May 02, 1966  DATE OF SERVICE:                                 CLINIC NOTE  The patient presents to the GYN Clinic the afternoon of Apr 21, 2011.  The patient presents with complaint of abdominal pain.  The patient was seen in the MAU on Mar 28, 2011, by a nurse practitioner for her abdominal pain.  She speaks very little if no English at all.  All information is provided through an interpreter today in the clinic, Clista Bernhardt.  When she was seen in the MAU, was noted to have abdominal pain, reported as a little cramp all the time since her surgery with her endometrial ablation in the very last part of November 2011.  She describes the pain now more like labor pain that radiates bilaterally around to her back.  She has had no vaginal bleeding or discharge.  She complains of no frequency of urination or urgency or burning and she also states that she has had no change in her bowel movements, reporting normal bowel movement every day with no straining.  The patient has a past medical history of rheumatoid arthritis and takes dexamethasone every day and also is given high strength Tylenol to take, but has not taken any anti-inflammatory medications like Motrin, naproxen or ibuprofen.  The patient states that she has no reason not to be on these medications.  She gets her care for her rheumatoid arthritis and her medications through health survey except a dexamethasone what she purchases at [employer].  The patient has significant financial concerns. The patient states that the reason she had the NovaSure procedure in November instead of an IUD, it was because she was told that IUD would not have this kind of results as a NovaSure and that is why, she opted for  surgery.  She last had an endometrial biopsy in February 2011, which was benign.  She had an ultrasound ordered for her after being seen in the MAU and the results are available today.  Review of medical history shows that she has rheumatoid arthritis and surgeries include the NovaSure procedure.  OBSTETRICAL HISTORY:  She has a history of 3 vaginal deliveries and 1 miscarriage.  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  PHYSICAL EXAMINATION:  VITAL SIGNS:  Today, the patient has a blood pressure 127/76, pulse of 91, temperature 97.5, weight of 179.9 pounds, height is 61.5 inches. GENERAL:  The patient is a pleasant Hispanic female who looks her stated age of 32. HEART:  Regular rate and rhythm with no audible murmurs. LUNGS:  Clear to auscultation bilaterally and her thyroid is nonpalpable. EXTERNAL FEMALE GENITALIA:  Normal in appearance.  Internal speculum exam shows appropriate amount of moisture in rugae.  Cervix is easily identified.  The patient had previously been consented and timed out for an endometrial biopsy which is attempted at this time.  The endometrial product cannot be passed through  the cervical os and the dilator was attempted.  The patient was in extreme amount of pain and the dilator could not be passed.  The procedure was aborted at this time secondary to the patient's discomfort and cervical stenosis.  Radiology results shows that the uterus was enlarged of 12.1 x 6.7 x 7.7 cm, suspicious for adenomyoma with a finding of diffusely irregular endometrial thickening with a recommendation of endometrial sampling.  ASSESSMENT: 1. Dysfunctional uterine bleeding status post NovaSure procedure, now     with significant abdominal pain.  Endometrial biopsy was attempted     today, but cannot be completed.  The patient has an abnormal     ultrasound showing enlarged uterus and thickened endometrial     lining,  which would not be expected after an ablation.  The patient     will be scheduled to see a surgeon to discuss her options at this     point. 2. The patient had an abnormal mammogram in June 2011, was recommended     for her to have a sixth month followup of her left breast which she     did not occur.  The patient is scheduled today for her repeat     diagnostic left breast mammogram and a routine screening mammogram     of the right breast.  The patient will follow up in the clinic to     see Surgery, is given prescriptions for Ultram and Motrin for pain     control and is in agreement with the plan.          ______________________________ Lucina Mellow, DO    SH/MEDQ  D:  04/21/2011  T:  04/22/2011  Job:  [id number]

## 2011-04-26 ENCOUNTER — Ambulatory Visit
Admission: RE | Admit: 2011-04-26 | Discharge: 2011-04-26 | Disposition: A | Discharge status: Home / Self Care | Payer: Self-pay | Source: Ambulatory Visit | Attending: Family Medicine | Admitting: Family Medicine

## 2011-04-26 ENCOUNTER — Other Ambulatory Visit: Payer: Self-pay | Admitting: Family Medicine

## 2011-04-26 DIAGNOSIS — N6320 Unspecified lump in the left breast, unspecified quadrant: Secondary | ICD-10-CM

## 2011-04-27 ENCOUNTER — Other Ambulatory Visit (HOSPITAL_COMMUNITY): Payer: Self-pay | Admitting: Family Medicine

## 2011-04-27 DIAGNOSIS — IMO0002 Reserved for concepts with insufficient information to code with codable children: Secondary | ICD-10-CM

## 2011-04-29 ENCOUNTER — Other Ambulatory Visit: Payer: Self-pay | Admitting: Psychiatry

## 2011-05-03 ENCOUNTER — Ambulatory Visit (HOSPITAL_COMMUNITY): Payer: Self-pay | Attending: Family Medicine

## 2011-05-24 ENCOUNTER — Other Ambulatory Visit: Payer: Self-pay | Admitting: Obstetrics and Gynecology

## 2011-05-24 ENCOUNTER — Other Ambulatory Visit (HOSPITAL_COMMUNITY)
Admission: RE | Admit: 2011-05-24 | Discharge: 2011-05-24 | Disposition: A | Discharge status: Home / Self Care | Payer: Self-pay | Source: Ambulatory Visit | Attending: Family Medicine | Admitting: Family Medicine

## 2011-05-24 ENCOUNTER — Ambulatory Visit: Payer: Self-pay | Admitting: Obstetrics and Gynecology

## 2011-05-24 DIAGNOSIS — N949 Unspecified condition associated with female genital organs and menstrual cycle: Secondary | ICD-10-CM | POA: Insufficient documentation

## 2011-05-24 DIAGNOSIS — N938 Other specified abnormal uterine and vaginal bleeding: Secondary | ICD-10-CM | POA: Insufficient documentation

## 2011-05-25 NOTE — Group Therapy Note (Signed)
[name], EMS       ACCOUNT NO.:  [id number]  MEDICAL RECORD NO.:  [id number]           PATIENT TYPE:  A  LOCATION:  [facility]                   FACILITY:  [facility]  PHYSICIAN:  Catalina Antigua, MD     DATE OF BIRTH:  1966/03/05  DATE OF SERVICE:  05/24/2011                                 CLINIC NOTE  A 45 year old G4, P 3-0-1-3 who is status post endometrial ablation with NovaSure in November 2011 who presents today for followup examination for possible surgery and endometrial biopsy secondary to pelvic pain. The patient was last seen on May 30 as MAU follow up.  The patient was seen in the MAU on May 6 secondary to abdominal pain which she described as severe cramping pain similar to labor pain.  The patient states that since her endometrial ablation she has not had a period and has been satisfied with the results of the procedure in terms of her vaginal bleeding but states that her abdominal pain seems to have worsened since the procedure.  The patient had an ultrasound on Apr 12, 2011, which showed a uterus measuring 12 x 6 x 7, endometrium with a small amount of fluid within the endometrial cavity revealing a diffuse irregular endometrium and the full-thickness being 13 mm.  The patient presented at the end of May for endometrial sampling in view of this thickened endometrium.  However, procedure cannot be performed secondary to presence of cervical stenosis.  It was explained to the patient that I suspected that she had a thickened endometrial lining secondary to the development of hematometra and that if we were able to dilate her cervix that it would significantly relieve her symptoms.  The patient agreed to attempt cervical dilation with endometrial biopsy in the office and if that was not possible, we would schedule the operative procedure instead.  After informed consent was obtained and following the standard sterile technique, I was able to dilate her cervix  using a cervical dilator.  A copious amount of thick, dark blood was visualized at the level of the external os and was evacuated from the uterus.  Endometrial biopsy was performed.  Attempt to further dilate the cervix was not tolerated by the patient.  The patient will then return in 2-3 weeks to further discuss the results of the endometrial biopsy and to further evaluate her symptoms.  It was explained to the patient that if her abdominal pain returned or if hematometra was to develop again that perhaps, placement of an IUD would prevent hematometra from recurring.  The patient states that she would consider that option but desires to have a hysterectomy if the problems cannot be resolved in a different manner. The patient will return in 2-3 weeks for followup and discussion of the results of the endometrial biopsy.          ______________________________ Catalina Antigua, MD    PC/MEDQ  D:  05/24/2011  T:  05/25/2011  Job:  [id number]

## 2011-07-05 ENCOUNTER — Ambulatory Visit: Payer: Self-pay | Admitting: Family Medicine

## 2011-07-07 ENCOUNTER — Encounter: Payer: Self-pay | Admitting: Obstetrics and Gynecology

## 2011-07-07 ENCOUNTER — Ambulatory Visit (INDEPENDENT_AMBULATORY_CARE_PROVIDER_SITE_OTHER): Payer: Self-pay | Admitting: Obstetrics and Gynecology

## 2011-07-07 VITALS — BP 122/74 | HR 93 | Temp 97.9°F | Resp 20 | Wt 187.5 lb

## 2011-07-07 DIAGNOSIS — Z01419 Encounter for gynecological examination (general) (routine) without abnormal findings: Secondary | ICD-10-CM

## 2011-07-07 NOTE — Patient Instructions (Signed)
Cuidados preventivos en las mujeres adultas (Preventative Care for Adults - Female) Algunos estudios han demostrado que actualmente la mitad de las muertes en los [place] se deben a un estilo de vida poco saludable. En l se incluye el hecho de ignorar las indicaciones de un cuidado preventivo. Las pautas preventivas de salud para las mujeres incluyen las siguientes prcticas clave:   Un examen fsico anual de rutina es una buena manera de consultar con su mdico de cabecera sobre su salud y controles preventivos. Es la oportunidad de compartir las preocupaciones y novedades sobre su salud, y realizarse un examen a fondo.   Si usted fuma cigarrillos, consulte con su mdico cmo dejarlo. Esto, literalmente, puede salvar su vida, no importa cunto tiempo haya sido consumidor de tabaco. Si no lo hace, no comience.   Mantenga una dieta saludable y un peso normal. El aumento de peso conduce a problemas con la presin arterial y a la diabetes. Disminuya el consumo de grasas de la dieta y aumente el ejercicio. Coma una variedad de alimentos, incluyendo frutas, verduras, protena animal y vegetal (carne, pescado, pollo y huevos, o frijoles, lentejas y queso de soja) y granos, como el arroz. Si es necesario, pdale una dieta adecuada al profesional que lo asiste.   El ejercicio aerbico ayuda a mantener la buena salud del corazn. Los CDC y el American College of Sports [employer] recomiendan 30 minutos de ejercicio de intensidad moderada (caminar a paso ligero que aumenta su ritmo cardaco y la respiracin) en casi todos los das de la semana. La presin arterial elevada que persiste debe tratarse con medicamentos si la prdida de peso y el ejercicio no son efectivos.   Evite el fumar y el beber en exceso (ms de dos copas por da) o el uso de drogas callejeras. No comparta agujas. Pida ayuda si necesita asistencia o instrucciones con respecto a abandonar el consumo de alcohol, cigarrillos o drogas.    Mantenga los lpidos colesterol en sangre normales reduciendo al mnimo su consumo de grasas saturadas. Consuma una dieta balanceada con frutas y verduras en cantidad. El Instit[employer] alienta a las mujeres a comer 5-9 porciones de frutas y verduras cada da. El profesional que lo asiste podr darle instrucciones para ayudarla a mantener un nivel bajo de riesgo de contraer enfermedades cardacas o infarto. La presin arterial elevada causa problemas cardacos y aumenta el riesgo de ictus. La presin arterial debe controlarse cada 1-2 aos, de 20 aos de edad en adelante.   Los anlisis de sangre para el colesterol alto, que causan enfermedades de corazn y los vasos sanguneos, deben comenzar a los 20 aos y repetirse cada 5 aos, si los resultados son normales. (Repetir las pruebas con ms frecuencia si los resultados son altos.)   El control de diabetes consiste en tomar una muestra de sangre para comprobar su nivel de azcar en la sangre, despus de un perodo de ayuno. Esto se hace una vez cada 3 aos, despus de los 45 aos de edad, si los resultados son normales.   La deteccin del cncer de mama es fundamental para el cuidado preventivo de las mujeres. Todas las mujeres de 20 aos o ms deben realizar un autoexamen de los senos cada mes. A la edad de 40 aos o ms, las mujeres deben realizarse un examen de mama cada ao. Entre los 40 y los 50 aos, las mujeres deben realizarse una mamografa (radiografa) de los senos cada ao. El profesional le indicar cundo comenzar   sus mamografas anuales.   El control del cncer cervical incluye un PAP (muestra de clulas que se examinan bajo un microscopio) del cuello uterino (extremo del tero). Tambin incluye la prueba para el VPH (virus del papiloma humano, que puede causar cncer de cuello uterino). El control y examen plvico debe comenzar a los 21 aos, o 3 aos despus de que una mujer sea sexualmente activa. La inspeccin debe  realizarse cada ao, con una prueba de Papanicolaou pero no de VPH, hasta los 30 aos de edad. Despus de los 30 aos de edad, debe hacerse un Papanicolau cada 3 aos con prueba de VPH, si no se ha detectado VPH con anterioridad.   El cncer de colon se puede detectar y con frecuencia prevenir, mucho antes de que sea peligroso para la vida. La mayora de los controles de rutina del cncer de colon comienza a los 50 aos. Sobre una base anual, su mdico puede proporcionar pruebas de fcil utilizacin para llevar a cabo en casa para detectar sangre oculta en las heces. El uso de una pequea cmara en el extremo de un tubo, para examinar directamente el colon (sigmoidoscopia o colonoscopia), puede detectar las primeras formas de cncer de colon y puede salvar la vida. Hable con su mdico acerca de esto a los 50 aos, cuando debe comenzar con la investigacin de rutina. (El control se repite cada 5 aos, a menos que se encuentren formas de plipos pre-cancerosos o tumores pequeos.)   Practicar sexo seguro. Use preservativos. Los condones se utilizan para el control de la natalidad y para reducir la propagacin de las infecciones de transmisin sexual (ETS). El sexo no seguro es la actividad sexual sin proteccin, como preservativos y el evitar realizar actos de alto riesgo, para reducir las probabilidades de contraer o propagar enfermedades de transmisin sexual. Algunas de estas enfermedades son: gonorrea, clamidia, sfilis, tricomonas, herpes, VPH (virus del papiloma humano) y VIH (virus de inmunodeficiencia humana) que causa el virus del SIDA. El herpes, el VIH y el VPH son enfermedades virales que no tienen cura. Pueden producir discapacidad, cncer y hasta la muerte.   El VPH causa cncer del cuello del tero, y otras infecciones que pueden transmitirse de persona a persona Existe una vacuna para el VPH y las mujeres deben recibir la vacuna entre las edades de 11 y 26 aos. Se requiere una serie de tres  inyecciones.   La osteoporosis es una enfermedad en la cual los huesos pierden minerales y fuerza a medida que envejecemos. Esto puede dar lugar a fracturas seas graves. El riesgo de osteoporosis puede identificarse mediante una densitometra sea. Las mujeres de ms de 65 aos deben consultar con su mdico, al igual que las mujeres post menopausia que tienen otros factores de riesgo. Pregunte a su mdico si debe tomar un suplemento de calcio y vitamina D, para reducir la osteoporosis.   La menopausia puede estar asociada con sntomas fsicos y los riesgos. Se encuentre disponible una terapia de reemplazo de hormonas para diminuirlos. Consulte a su mdico para saber si la toma de hormonas es conveniente para usted.   Use una pantalla solar con un factor SPF de 15 o mayor. Aplique protector solar en abundancia y en varias ocasiones durante el da. Permanecer bajo el sol cuando la sombra que ocasiona el sol es ms chica que la persona, significa que est expuesto a una mayor intensidad. Las personas que se broncean con lmpara tienen un riesgo mayor de sufrir cncer de piel. Use gafas de   sol, para proteger sus ojos del dao de la luz solar (que puede acelerar la formacin de cataratas).   Una vez al mes, hgase un examen la piel del cuerpo entero o de revisin, utilizando un espejo para mirar a su espalda. Notifique al profesional que lo asiste si observa modificaciones en lunares, especialmente en su forma, color, si el tamao es mayor que el de una goma de borrar, o aparecen nuevos lunares.   Mantenga siempre en funcionamiento los detectores de monxido de carbono y de humo dentro del hogar. Cambie las bateras cada seis meses, o utilice un modelo que se enchufe en la pared.   Mantngase al da con sus vacunas contra el ttanos y otras vacunas de rutina. Un refuerzo para el ttanos se debe dar cada 10 aos. Asegrese de aplicarse la vacuna contra la gripe cada ao, dado que el 5% -20% de la poblacin de  [place] se enferma debido a la gripe. La composicin de la vacuna contra la gripe cambia cada ao, por lo que ser vacunados una vez no es suficiente. Vacnese en el otoo, antes del pico de la temporada de gripe. La siguiente tabla muestra las vacunas importantes. Otras vacunas a considerar incluyen el virus de la hepatitis A (para prevenir una forma de infeccin del hgado por un virus adquirido de los alimentos), la varicela zoster (el virus que causa el herpes), y Meningococco (contra las bacterias que causan una forma de meningitis).   Lvese los dientes dos veces al da con pasta con fluoruro y utilice hilo dental diariamente. La buena higiene oral previene la caries dental y enfermedades de las encas, que puede ser dolorosa y puede causar otros problemas de salud. Visite a su dentista para una revisin de rutina oral y dental y cuidado preventivo cada 6-12 meses.   El ndice de Masa Corporal o IMC es una manera de medir la cantidad de grasa de su cuerpo. Un IMC superior a 27 aumenta el riesgo de enfermedades cardacas, diabetes, hipertensin, infartos y otros problemas relacionados con la obesidad. El mdico puede ayudar a determinar su ndice de masa corporal, y puede desarrollar un programa de ejercicio y una dieta para ayudarle a alcanzar o mantener esta medida en un nivel saludable.   Use cinturn de seguridad cada vez que se encuentra en un vehculo, ya sea como pasajero o conductor, e incluso para los viajes cortos de unos pocos minutos.   Si utiliza bicicleta, use el casco todo el tiempo.  Cuidados preventivos en los mujeres adultas  Controles preventivos  Edad 19-39 Edad 40-64 Edad 65 o ms  Evaluacin de los riesgos para la salud y asesoramiento sobre el estilo de vida     Control de la presin arterial** Cada 1-2 aos Cada 1-2 aos Cada 1-2 aos  Colesterol total con HDL ** Cada 5 aos, comenzando a los 35 aos Cada 5 aos, comenzando a los 20, o con ms frecuencia si el riesgo es alto.  Cada 5 aos hasta los 75 aos, luego opcional  Autoexamen de mamas Cada mes en mujeres mayores de 20 aos. Cada mes Cada mes  Examen clnico de mamas** Cada 3 aos, comenzando a los 20 aos Todos los aos Todos los aos  Mamografa**   Todos los aos comenzando a los 50, opcional desde los 40-49 aos (convrselo con el profesional) Cada 5 aos hasta los 75 aos, luego opcional  Papanicolau** y control de VPH. Cada ao desde los 21 hasta los 29 aos Cada 3 aos a   partir de los 30 hasta los 65 si el VPH es negativo. Opcional; convrselo con el profesional  Sigmoidoscopa flexible** o colonoscopa**.   Cada 5 aos, comenzando a los 50 aos Cada 5 aos hasta los 80 aos, luego opcional  Prueba de sangre oculta en materia fecal   Cada 5 aos, comenzando a los 50 aos Todos los aos hasta los 80 aos, luego opcional  Autoexamen de piel Cada mes Cada mes Cada mes  Vacuna DT (difteria/ttanos) Cada 10 aos Cada 10 aos Cada 10 aos  Vacuna contra la gripe** Todos los aos Todos los aos Todos los aos  Vacuna contra la VPH Una vez entre 11 y los 26 aos de edad.     Vacuna antineumocccica** Opcional Opcional Cada 5 aos  Vacuna contra la hepatitis B** Serie de 3 vacunas. (si no se ha administrado previamente, por lo general se da a los 0, 1 a 2 y 4 a 6 meses).  Consulte con el profesional si no se la ha administrado. Consulte con el profesional si no se la ha administrado.  **La historia familiar y personal de riesgos y enfermedades puede cambiar las recomendaciones del mdico.  Document Released: 08/18/2005 Document Re-Released: 02/02/2010 [employer] Patient Information 2011 [employer]. 

## 2011-07-07 NOTE — Progress Notes (Signed)
  Subjective:    Patient ID: [name], female    DOB: 07-15-1966, 45 y.o.   MRN: [id number]  HPI 45 yo G4 P3013 s/p endometrial ablation in 09/2010 last seen in early July for management of hematometra. After dilating her cervix during that visit, patient experienced a period on 7/10 and again 8/5. Patient describes these periods as lasting 5 days and are normal in flow without excessive bleeding or clots. Patient also denies any pelvic pain. Patient is content with the results thus far.  PMHx: arthritis  PShx: Endometrial ablation November 2011  PObHx: 3 NSVD, 1 Sab  PGynHx: Denies cyst, fibroids, abnormal pap smear  Family Hx: DM  Social Hx: Denies drinking, smoking or the use of illicit drugs  Review of Systems  All other systems reviewed and are negative.  Patient reports an episode of dizziness this past Sunday which resolved on this own. Patient states that she keeps herself well hydrated and is eating well.     Objective:   Physical Exam  GENERAL: Well-developed, well-nourished female in no acute distress.  HEENT: Normocephalic, atraumatic. Sclerae anicteric.  NECK: Supple. Normal thyroid.  LUNGS: Clear to auscultation bilaterally.  HEART: Regular rate and rhythm. BREASTS: Symmetric in size. No palpable masses or lymphadenopathy, skin changes, or nipple drainage. ABDOMEN: Soft, nontender, nondistended. No organomegaly. PELVIC: Normal external female genitalia. Vagina is pink and rugated.  Normal discharge. Normal appearing cervix. Uterus is 10-week size.  No adnexal mass or tenderness. EXTREMITIES: No cyanosis, clubbing, or edema, 2+ distal pulses.      Assessment & Plan:  45 yo G4 P3013 s/p endometrial abaltion in Nov 2011 here for follow-up and annual exam - pap smear performed - pt had mamogram this year already - patient strongly encouraged to follow-up with pcp for eval of dizziness.

## 2011-07-13 ENCOUNTER — Other Ambulatory Visit: Payer: Self-pay

## 2011-07-22 ENCOUNTER — Emergency Department (HOSPITAL_COMMUNITY): Payer: Self-pay

## 2011-07-22 ENCOUNTER — Emergency Department (HOSPITAL_COMMUNITY)
Admission: EM | Admit: 2011-07-22 | Discharge: 2011-07-22 | Disposition: A | Discharge status: Home / Self Care | Payer: Self-pay | Attending: Emergency Medicine | Admitting: Emergency Medicine

## 2011-07-22 DIAGNOSIS — R0989 Other specified symptoms and signs involving the circulatory and respiratory systems: Secondary | ICD-10-CM | POA: Insufficient documentation

## 2011-07-22 DIAGNOSIS — M129 Arthropathy, unspecified: Secondary | ICD-10-CM | POA: Insufficient documentation

## 2011-07-22 DIAGNOSIS — R0609 Other forms of dyspnea: Secondary | ICD-10-CM | POA: Insufficient documentation

## 2011-07-22 DIAGNOSIS — I10 Essential (primary) hypertension: Secondary | ICD-10-CM | POA: Insufficient documentation

## 2011-07-22 DIAGNOSIS — Z79899 Other long term (current) drug therapy: Secondary | ICD-10-CM | POA: Insufficient documentation

## 2011-07-22 DIAGNOSIS — F411 Generalized anxiety disorder: Secondary | ICD-10-CM | POA: Insufficient documentation

## 2011-07-22 LAB — CBC
HCT: 35.5 % — ABNORMAL LOW (ref 36.0–46.0)
Hemoglobin: 12 g/dL (ref 12.0–15.0)
MCH: 29.5 pg (ref 26.0–34.0)
MCHC: 33.8 g/dL (ref 30.0–36.0)
MCV: 87.2 fL (ref 78.0–100.0)
Platelets: 601 10*3/uL — ABNORMAL HIGH (ref 150–400)
RBC: 4.07 MIL/uL (ref 3.87–5.11)
RDW: 14.8 % (ref 11.5–15.5)
WBC: 13.2 10*3/uL — ABNORMAL HIGH (ref 4.0–10.5)

## 2011-07-22 LAB — BASIC METABOLIC PANEL
BUN: 15 mg/dL (ref 6–23)
CO2: 26 mEq/L (ref 19–32)
Calcium: 9.6 mg/dL (ref 8.4–10.5)
Chloride: 104 mEq/L (ref 96–112)
Creatinine, Ser: 0.57 mg/dL (ref 0.50–1.10)
GFR calc Af Amer: >60 mL/min (ref >60)
GFR calc non Af Amer: >60 mL/min (ref >60)
Glucose, Bld: 94 mg/dL (ref 70–99)
Potassium: 3.6 mEq/L (ref 3.5–5.1)
Sodium: 138 mEq/L (ref 135–145)

## 2011-07-22 LAB — DIFFERENTIAL
Basophils Absolute: 0 10*3/uL (ref 0.0–0.1)
Basophils Relative: 0 % (ref 0–1)
Eosinophils Absolute: 0.1 10*3/uL (ref 0.0–0.7)
Eosinophils Relative: 1 % (ref 0–5)
Lymphocytes Relative: 22 % (ref 12–46)
Lymphs Abs: 2.8 10*3/uL (ref 0.7–4.0)
Monocytes Absolute: 0.8 10*3/uL (ref 0.1–1.0)
Monocytes Relative: 6 % (ref 3–12)
Neutro Abs: 9.4 10*3/uL — ABNORMAL HIGH (ref 1.7–7.7)
Neutrophils Relative %: 71 % (ref 43–77)

## 2011-07-22 LAB — POCT I-STAT TROPONIN I: Troponin i, poc: 0.01 ng/mL (ref 0.00–0.08)

## 2011-08-06 ENCOUNTER — Other Ambulatory Visit (HOSPITAL_COMMUNITY): Payer: Self-pay | Admitting: Family Medicine

## 2011-08-06 DIAGNOSIS — R221 Localized swelling, mass and lump, neck: Secondary | ICD-10-CM

## 2011-08-10 ENCOUNTER — Ambulatory Visit (HOSPITAL_COMMUNITY)
Admission: RE | Admit: 2011-08-10 | Discharge: 2011-08-10 | Disposition: A | Discharge status: Home / Self Care | Payer: Self-pay | Source: Ambulatory Visit | Attending: Family Medicine | Admitting: Family Medicine

## 2011-08-10 DIAGNOSIS — R220 Localized swelling, mass and lump, head: Secondary | ICD-10-CM | POA: Insufficient documentation

## 2011-08-10 DIAGNOSIS — R7989 Other specified abnormal findings of blood chemistry: Secondary | ICD-10-CM | POA: Insufficient documentation

## 2011-08-10 DIAGNOSIS — R490 Dysphonia: Secondary | ICD-10-CM | POA: Insufficient documentation

## 2011-08-10 DIAGNOSIS — R221 Localized swelling, mass and lump, neck: Secondary | ICD-10-CM

## 2011-08-10 MED ORDER — IOHEXOL 300 MG/ML  SOLN
70.0000 mL | Freq: Once | INTRAMUSCULAR | Status: AC | PRN
  Administered 2011-08-10: 70 mL via INTRAVENOUS

## 2011-08-12 LAB — CBC
HCT: 29.2 — ABNORMAL LOW
Hemoglobin: 9.8 — ABNORMAL LOW
Platelets: 722 — ABNORMAL HIGH
RBC: 3.77 — ABNORMAL LOW
WBC: 11.7 — ABNORMAL HIGH

## 2011-08-12 LAB — WET PREP, GENITAL
Clue Cells Wet Prep HPF POC: NONE SEEN
Trich, Wet Prep: NONE SEEN
WBC, Wet Prep HPF POC: NONE SEEN

## 2011-08-12 LAB — URINALYSIS, ROUTINE W REFLEX MICROSCOPIC: Specific Gravity, Urine: <1.005 — ABNORMAL LOW

## 2011-08-12 LAB — TSH: TSH: 1.789

## 2011-08-12 LAB — GC/CHLAMYDIA PROBE AMP, GENITAL: GC Probe Amp, Genital: NEGATIVE

## 2011-08-27 LAB — COMPREHENSIVE METABOLIC PANEL
AST: 13 U/L (ref 0–37)
Albumin: 2 g/dL — ABNORMAL LOW (ref 3.5–5.2)
Alkaline Phosphatase: 77 U/L (ref 39–117)
Chloride: 108 mEq/L (ref 96–112)
GFR calc Af Amer: >60 mL/min (ref >60)
Potassium: 3.9 mEq/L (ref 3.5–5.1)
Sodium: 139 mEq/L (ref 135–145)
Total Bilirubin: 0.3 mg/dL (ref 0.3–1.2)

## 2011-08-27 LAB — CULTURE, BLOOD (ROUTINE X 2)

## 2011-08-27 LAB — CBC
HCT: 26.1 % — ABNORMAL LOW (ref 36.0–46.0)
HCT: 29.6 % — ABNORMAL LOW (ref 36.0–46.0)
HCT: 31.4 % — ABNORMAL LOW (ref 36.0–46.0)
HCT: 32.2 % — ABNORMAL LOW (ref 36.0–46.0)
Hemoglobin: 9.3 g/dL — ABNORMAL LOW (ref 12.0–15.0)
MCHC: 32.4 g/dL (ref 30.0–36.0)
MCHC: 32.5 g/dL (ref 30.0–36.0)
MCV: 72.1 fL — ABNORMAL LOW (ref 78.0–100.0)
MCV: 75.1 fL — ABNORMAL LOW (ref 78.0–100.0)
MCV: 75.4 fL — ABNORMAL LOW (ref 78.0–100.0)
Platelets: 583 10*3/uL — ABNORMAL HIGH (ref 150–400)
Platelets: 617 10*3/uL — ABNORMAL HIGH (ref 150–400)
RBC: 4.1 MIL/uL (ref 3.87–5.11)
RBC: 4.17 MIL/uL (ref 3.87–5.11)
RDW: 18.5 % — ABNORMAL HIGH (ref 11.5–15.5)
WBC: 7.1 10*3/uL (ref 4.0–10.5)
WBC: 7.7 10*3/uL (ref 4.0–10.5)
WBC: 7.8 10*3/uL (ref 4.0–10.5)
WBC: 8.6 10*3/uL (ref 4.0–10.5)

## 2011-08-27 LAB — URINALYSIS, ROUTINE W REFLEX MICROSCOPIC
Ketones, ur: NEGATIVE mg/dL
Nitrite: POSITIVE — AB
Specific Gravity, Urine: 1.013 (ref 1.005–1.030)
Urobilinogen, UA: 1 mg/dL (ref 0.0–1.0)
pH: 6.5 (ref 5.0–8.0)

## 2011-08-27 LAB — URINE CULTURE: Colony Count: >=100000

## 2011-08-27 LAB — BODY FLUID CULTURE: Culture: NO GROWTH

## 2011-08-27 LAB — BASIC METABOLIC PANEL
BUN: 5 mg/dL — ABNORMAL LOW (ref 6–23)
CO2: 26 mEq/L (ref 19–32)
Chloride: 102 mEq/L (ref 96–112)
Chloride: 105 mEq/L (ref 96–112)
Creatinine, Ser: 0.48 mg/dL (ref 0.4–1.2)
GFR calc Af Amer: >60 mL/min (ref >60)
GFR calc Af Amer: >60 mL/min (ref >60)
Potassium: 3.5 mEq/L (ref 3.5–5.1)
Potassium: 3.7 mEq/L (ref 3.5–5.1)

## 2011-08-27 LAB — DIFFERENTIAL
Basophils Absolute: 0 10*3/uL (ref 0.0–0.1)
Basophils Relative: 0 % (ref 0–1)
Eosinophils Absolute: 0 10*3/uL (ref 0.0–0.7)
Eosinophils Relative: 0 % (ref 0–5)
Eosinophils Relative: 1 % (ref 0–5)
Lymphs Abs: 1.7 10*3/uL (ref 0.7–4.0)
Monocytes Absolute: 0.5 10*3/uL (ref 0.1–1.0)
Monocytes Absolute: 0.7 10*3/uL (ref 0.1–1.0)
Monocytes Relative: 7 % (ref 3–12)
Monocytes Relative: 9 % (ref 3–12)

## 2011-08-27 LAB — FOLATE RBC: RBC Folate: 981 ng/mL — ABNORMAL HIGH (ref 180–600)

## 2011-08-27 LAB — RENAL FUNCTION PANEL
Glucose, Bld: 77 mg/dL (ref 70–99)
Phosphorus: 3.4 mg/dL (ref 2.3–4.6)
Potassium: 3.4 mEq/L — ABNORMAL LOW (ref 3.5–5.1)
Sodium: 138 mEq/L (ref 135–145)

## 2011-08-27 LAB — GRAM STAIN

## 2011-08-27 LAB — MISCELLANEOUS TEST

## 2011-08-27 LAB — CROSSMATCH

## 2011-08-27 LAB — ANTI-DNA ANTIBODY, DOUBLE-STRANDED: ds DNA Ab: 1 IU/mL (ref <5)

## 2011-08-27 LAB — ANA: Anti Nuclear Antibody(ANA): NEGATIVE

## 2011-08-27 LAB — AFB CULTURE WITH SMEAR (NOT AT ARMC)
Acid Fast Smear: NONE SEEN
Acid Fast Smear: NONE SEEN

## 2011-08-27 LAB — ANAEROBIC CULTURE

## 2011-08-27 LAB — SYNOVIAL CELL COUNT + DIFF, W/ CRYSTALS
Crystals, Fluid: NONE SEEN
Monocyte-Macrophage-Synovial Fluid: 15 % — ABNORMAL LOW (ref 50–90)
WBC, Synovial: 53030 /mm3 — ABNORMAL HIGH (ref 0–200)

## 2011-08-27 LAB — GC/CHLAMYDIA PROBE AMP, URINE
Chlamydia, Swab/Urine, PCR: NEGATIVE
GC Probe Amp, Urine: NEGATIVE

## 2011-08-27 LAB — POCT PREGNANCY, URINE: Preg Test, Ur: NEGATIVE

## 2011-08-27 LAB — URINE MICROSCOPIC-ADD ON

## 2011-08-27 LAB — IRON AND TIBC: Iron: 11 ug/dL — ABNORMAL LOW (ref 42–135)

## 2011-08-27 LAB — GLUCOSE, SYNOVIAL FLUID: Glucose, Synovial Fluid: 0 mg/dL

## 2011-08-27 LAB — FERRITIN: Ferritin: 2 ng/mL — ABNORMAL LOW (ref 10–291)

## 2011-08-30 LAB — GC/CHLAMYDIA PROBE AMP, GENITAL: Chlamydia, DNA Probe: NEGATIVE

## 2011-08-30 LAB — CBC
HCT: 31.8 — ABNORMAL LOW
Hemoglobin: 10.5 — ABNORMAL LOW
RBC: 3.98
WBC: 11 — ABNORMAL HIGH

## 2011-08-30 LAB — POCT PREGNANCY, URINE: Preg Test, Ur: NEGATIVE

## 2011-08-30 LAB — WET PREP, GENITAL

## 2012-05-30 ENCOUNTER — Other Ambulatory Visit (HOSPITAL_COMMUNITY): Payer: Self-pay | Admitting: Family Medicine

## 2012-05-30 DIAGNOSIS — IMO0002 Reserved for concepts with insufficient information to code with codable children: Secondary | ICD-10-CM

## 2012-06-05 ENCOUNTER — Ambulatory Visit (HOSPITAL_COMMUNITY)
Admission: RE | Admit: 2012-06-05 | Discharge: 2012-06-05 | Disposition: A | Discharge status: Home / Self Care | Payer: Self-pay | Source: Ambulatory Visit | Attending: Family Medicine | Admitting: Family Medicine

## 2012-06-05 DIAGNOSIS — IMO0002 Reserved for concepts with insufficient information to code with codable children: Secondary | ICD-10-CM | POA: Insufficient documentation

## 2012-06-05 DIAGNOSIS — Z13820 Encounter for screening for osteoporosis: Secondary | ICD-10-CM | POA: Insufficient documentation

## 2013-10-15 ENCOUNTER — Encounter (HOSPITAL_COMMUNITY): Payer: Self-pay | Admitting: Emergency Medicine

## 2013-10-15 ENCOUNTER — Emergency Department (HOSPITAL_COMMUNITY)
Admission: EM | Admit: 2013-10-15 | Discharge: 2013-10-16 | Disposition: A | Discharge status: Home / Self Care | Payer: Self-pay | Attending: Emergency Medicine | Admitting: Emergency Medicine

## 2013-10-15 DIAGNOSIS — Z8619 Personal history of other infectious and parasitic diseases: Secondary | ICD-10-CM | POA: Insufficient documentation

## 2013-10-15 DIAGNOSIS — R000 Tachycardia, unspecified: Secondary | ICD-10-CM | POA: Insufficient documentation

## 2013-10-15 DIAGNOSIS — R1084 Generalized abdominal pain: Secondary | ICD-10-CM | POA: Insufficient documentation

## 2013-10-15 DIAGNOSIS — R630 Anorexia: Secondary | ICD-10-CM | POA: Insufficient documentation

## 2013-10-15 DIAGNOSIS — D72829 Elevated white blood cell count, unspecified: Secondary | ICD-10-CM | POA: Insufficient documentation

## 2013-10-15 DIAGNOSIS — Z3202 Encounter for pregnancy test, result negative: Secondary | ICD-10-CM | POA: Insufficient documentation

## 2013-10-15 DIAGNOSIS — R112 Nausea with vomiting, unspecified: Secondary | ICD-10-CM | POA: Insufficient documentation

## 2013-10-15 DIAGNOSIS — Z8742 Personal history of other diseases of the female genital tract: Secondary | ICD-10-CM | POA: Insufficient documentation

## 2013-10-15 DIAGNOSIS — Z79899 Other long term (current) drug therapy: Secondary | ICD-10-CM | POA: Insufficient documentation

## 2013-10-15 DIAGNOSIS — R197 Diarrhea, unspecified: Secondary | ICD-10-CM | POA: Insufficient documentation

## 2013-10-15 LAB — COMPREHENSIVE METABOLIC PANEL
Albumin: 2.9 g/dL — ABNORMAL LOW (ref 3.5–5.2)
Alkaline Phosphatase: 103 U/L (ref 39–117)
BUN: 8 mg/dL (ref 6–23)
Chloride: 98 mEq/L (ref 96–112)
Creatinine, Ser: 0.82 mg/dL (ref 0.50–1.10)
GFR calc Af Amer: >90 mL/min (ref >90)
Glucose, Bld: 137 mg/dL — ABNORMAL HIGH (ref 70–99)
Potassium: 4 mEq/L (ref 3.5–5.1)
Total Bilirubin: 0.3 mg/dL (ref 0.3–1.2)
Total Protein: 8.6 g/dL — ABNORMAL HIGH (ref 6.0–8.3)

## 2013-10-15 LAB — CBC WITH DIFFERENTIAL/PLATELET
Basophils Relative: 0 % (ref 0–1)
Eosinophils Absolute: 0 10*3/uL (ref 0.0–0.7)
HCT: 36.2 % (ref 36.0–46.0)
Hemoglobin: 11.8 g/dL — ABNORMAL LOW (ref 12.0–15.0)
Lymphs Abs: 1.8 10*3/uL (ref 0.7–4.0)
MCH: 28 pg (ref 26.0–34.0)
MCHC: 32.6 g/dL (ref 30.0–36.0)
MCV: 85.8 fL (ref 78.0–100.0)
Monocytes Absolute: 0.5 10*3/uL (ref 0.1–1.0)
Monocytes Relative: 4 % (ref 3–12)
Neutrophils Relative %: 84 % — ABNORMAL HIGH (ref 43–77)
RBC: 4.22 MIL/uL (ref 3.87–5.11)

## 2013-10-15 LAB — URINALYSIS, ROUTINE W REFLEX MICROSCOPIC
Ketones, ur: NEGATIVE mg/dL
Nitrite: NEGATIVE
Protein, ur: NEGATIVE mg/dL

## 2013-10-15 LAB — POCT PREGNANCY, URINE: Preg Test, Ur: NEGATIVE

## 2013-10-15 MED ORDER — SODIUM CHLORIDE 0.9 % IV SOLN
1000.0000 mL | INTRAVENOUS | Status: DC
  Administered 2013-10-16: 1000 mL via INTRAVENOUS

## 2013-10-15 MED ORDER — SODIUM CHLORIDE 0.9 % IV SOLN
1000.0000 mL | Freq: Once | INTRAVENOUS | Status: AC
  Administered 2013-10-16: 1000 mL via INTRAVENOUS

## 2013-10-15 MED ORDER — ONDANSETRON HCL 4 MG/2ML IJ SOLN
4.0000 mg | Freq: Once | INTRAMUSCULAR | Status: AC
  Administered 2013-10-15: 4 mg via INTRAVENOUS
  Filled 2013-10-15: qty 2

## 2013-10-15 MED ORDER — SODIUM CHLORIDE 0.9 % IV SOLN
1000.0000 mL | Freq: Once | INTRAVENOUS | Status: AC
  Administered 2013-10-15: 1000 mL via INTRAVENOUS

## 2013-10-15 MED ORDER — ONDANSETRON HCL 4 MG/2ML IJ SOLN: 4.0000 mg | Freq: Once | INTRAMUSCULAR | Status: DC

## 2013-10-15 NOTE — ED Notes (Signed)
Pt. reports intermittent nausea, vomitting and diarrhea for several days with generalized weakness and poor appetite , denies abdominal pain , no fever / occasional chills.

## 2013-10-16 MED ORDER — METOCLOPRAMIDE HCL 5 MG/ML IJ SOLN
10.0000 mg | Freq: Once | INTRAMUSCULAR | Status: AC
  Administered 2013-10-16: 10 mg via INTRAVENOUS
  Filled 2013-10-16: qty 2

## 2013-10-16 MED ORDER — FENTANYL CITRATE 0.05 MG/ML IJ SOLN
50.0000 ug | Freq: Once | INTRAMUSCULAR | Status: DC
  Filled 2013-10-16: qty 2

## 2013-10-16 MED ORDER — ONDANSETRON 8 MG PO TBDP
8.0000 mg | ORAL_TABLET | Freq: Three times a day (TID) | ORAL | Status: DC | PRN
Start: 2013-10-16 — End: 2013-10-20

## 2013-10-16 NOTE — ED Notes (Signed)
Pt reports "not feeling well" for a month- N/V/D onset a week ago with decreased oral intake.  Pt was seen by doctor and told she had a bacterial infection in her stomach- given 2 different antibiotics that she has been taking.  Pt presents tonight with ongoing N/V/D and generalized abdominal pain.  Alert and oriented X 4.  IV in place, pt on cardiac monitor.

## 2013-10-16 NOTE — ED Provider Notes (Signed)
CSN: [id number]     Arrival date & time 10/15/13  2141 History   First MD Initiated Contact with Patient 10/15/13 2325     Chief Complaint  Patient presents with  . Emesis  . Diarrhea   (Consider location/radiation/quality/duration/timing/severity/associated sxs/prior Treatment) HPI 47 yo female presents to the ER from home with complaint of one month of feeling run down and ill "Sick", and one week of nausea, vomiting, and diarrhea.  She reports anorexia.  PMH of HTN, anemia, hypothyroid, rheumatoid arthritis.  Pt recently started on h pylori cocktail just prior to onset of n/v/d.  No fevers.  No sick contacts.  She reports some periumbilical pain.  No urinary symptoms, no headache, no rash.  Past Medical History  Diagnosis Date  . DUB (dysfunctional uterine bleeding)   . Abnormal Pap smear and cervical HPV (human papillomavirus)    Past Surgical History  Procedure Laterality Date  . Endometrial ablation     Family History  Problem Relation Age of Onset  . Diabetes Sister   . Thyroid disease Sister   . Diabetes Brother    History  Substance Use Topics  . Smoking status: Never Smoker   . Smokeless tobacco: Not on file  . Alcohol Use: No   OB History   Grav Para Term Preterm Abortions TAB SAB Ect Mult Living   4 3 3       3      Review of Systems  See History of Present Illness; otherwise all other systems are reviewed and negative  Allergies  Review of patient's allergies indicates no known allergies.  Home Medications   Current Outpatient Rx  Name  Route  Sig  Dispense  Refill  . amoxicillin (AMOXIL) 500 MG capsule   Oral   Take 500 mg by mouth 2 (two) times daily.         . clarithromycin (BIAXIN) 500 MG tablet   Oral   Take 500 mg by mouth 2 (two) times daily.         . ferrous sulfate 325 (65 FE) MG tablet   Oral   Take 325 mg by mouth daily.           . folic acid (FOLVITE) 1 MG tablet   Oral   Take 1 mg by mouth daily.         Marland Kitchen  levothyroxine (SYNTHROID, LEVOTHROID) 25 MCG tablet   Oral   Take 25 mcg by mouth daily before breakfast.         . lisinopril (PRINIVIL,ZESTRIL) 20 MG tablet   Oral   Take 20 mg by mouth daily.         Marland Kitchen omeprazole (PRILOSEC) 20 MG capsule   Oral   Take 20 mg by mouth daily.         . predniSONE (DELTASONE) 10 MG tablet   Oral   Take 10 mg by mouth daily with breakfast.         . sertraline (ZOLOFT) 50 MG tablet   Oral   Take 50 mg by mouth daily.         Marland Kitchen sulfaSALAzine (AZULFIDINE) 500 MG tablet   Oral   Take 500 mg by mouth 4 (four) times daily.         . traMADol (ULTRAM) 50 MG tablet   Oral   Take 50 mg by mouth every 6 (six) hours as needed.          BP 113/67  Pulse 128  Temp(Src) 99.5 F (37.5 C) (Oral)  Resp 23  SpO2 100% Physical Exam  Nursing note and vitals reviewed. Constitutional: She is oriented to person, place, and time. She appears well-developed and well-nourished.  Ill-appearing female, moderate distress  HENT:  Head: Normocephalic and atraumatic.  Right Ear: External ear normal.  Left Ear: External ear normal.  Nose: Nose normal.  Dry mucous membranes  Eyes: Conjunctivae and EOM are normal. Pupils are equal, round, and reactive to light.  Neck: Normal range of motion. Neck supple. No JVD present. No tracheal deviation present. No thyromegaly present.  Cardiovascular: Normal rate, regular rhythm, normal heart sounds and intact distal pulses.  Exam reveals no gallop and no friction rub.   No murmur heard. Tachycardia  Pulmonary/Chest: Effort normal and breath sounds normal. No stridor. No respiratory distress. She has no wheezes. She has no rales. She exhibits no tenderness.  Abdominal: Soft. Bowel sounds are normal. She exhibits no distension and no mass. There is tenderness (mild diffuse abdominal pain). There is no rebound and no guarding.  Musculoskeletal: Normal range of motion. She exhibits no edema and no tenderness.   Lymphadenopathy:    She has no cervical adenopathy.  Neurological: She is alert and oriented to person, place, and time. She exhibits normal muscle tone. Coordination normal.  Skin: Skin is warm and dry. No rash noted. No erythema. No pallor.  Psychiatric: She has a normal mood and affect. Her behavior is normal. Judgment and thought content normal.    ED Course  Procedures (including critical care time) Labs Review Labs Reviewed  CBC WITH DIFFERENTIAL - Abnormal; Notable for the following:    WBC 14.3 (*)    Hemoglobin 11.8 (*)    RDW 16.7 (*)    Platelets 772 (*)    Neutrophils Relative % 84 (*)    Neutro Abs 12.0 (*)    All other components within normal limits  COMPREHENSIVE METABOLIC PANEL - Abnormal; Notable for the following:    Sodium 134 (*)    Glucose, Bld 137 (*)    Total Protein 8.6 (*)    Albumin 2.9 (*)    GFR calc non Af Amer 83 (*)    All other components within normal limits  URINALYSIS, ROUTINE W REFLEX MICROSCOPIC - Abnormal; Notable for the following:    Leukocytes, UA TRACE (*)    All other components within normal limits  URINE MICROSCOPIC-ADD ON  POCT PREGNANCY, URINE   Imaging Review No results found.  EKG Interpretation    Date/Time:  Monday October 15 2013 23:26:33 EST Ventricular Rate:  116 PR Interval:  170 QRS Duration: 79 QT Interval:  333 QTC Calculation: 463 R Axis:   93 Text Interpretation:  Sinus tachycardia Borderline right axis deviation RSR' in V1 or V2, probably normal variant Baseline wander in lead(s) V4 V5 Confirmed by Audrielle Vankuren  MD, Marissa Lowrey ([id number]) on 10/16/2013 6:56:56 AM            MDM   1. Nausea vomiting and diarrhea   2. Anorexia    47 year old female with a week of nausea, vomiting, and diarrhea after starting antibiotics for H. pylori.  Suspect Biaxin is causing her symptoms.  Patient feels appears very dehydrated.  On exam.  She has elevated white blood cell count, but is on prednisone chronically.  Labs  otherwise, unremarkable.  EKG with sinus tach.  We'll plan to rehydrate, treat nausea.  Patient encouraged to eat, even if she is not hungry.  Hopefully by controlling  her nausea.  She will start to eat more.    [name], MD 10/16/13 [phone]

## 2013-10-19 ENCOUNTER — Encounter (HOSPITAL_COMMUNITY): Payer: Self-pay | Admitting: Emergency Medicine

## 2013-10-19 ENCOUNTER — Emergency Department (HOSPITAL_COMMUNITY)
Admission: EM | Admit: 2013-10-19 | Discharge: 2013-10-20 | Disposition: A | Discharge status: Home / Self Care | Payer: Self-pay | Attending: Emergency Medicine | Admitting: Emergency Medicine

## 2013-10-19 DIAGNOSIS — R1032 Left lower quadrant pain: Secondary | ICD-10-CM | POA: Insufficient documentation

## 2013-10-19 DIAGNOSIS — R110 Nausea: Secondary | ICD-10-CM | POA: Insufficient documentation

## 2013-10-19 DIAGNOSIS — R6883 Chills (without fever): Secondary | ICD-10-CM | POA: Insufficient documentation

## 2013-10-19 DIAGNOSIS — R5381 Other malaise: Secondary | ICD-10-CM | POA: Insufficient documentation

## 2013-10-19 DIAGNOSIS — D219 Benign neoplasm of connective and other soft tissue, unspecified: Secondary | ICD-10-CM

## 2013-10-19 DIAGNOSIS — Z8619 Personal history of other infectious and parasitic diseases: Secondary | ICD-10-CM | POA: Insufficient documentation

## 2013-10-19 DIAGNOSIS — R5383 Other fatigue: Secondary | ICD-10-CM

## 2013-10-19 DIAGNOSIS — Z79899 Other long term (current) drug therapy: Secondary | ICD-10-CM | POA: Insufficient documentation

## 2013-10-19 DIAGNOSIS — Z8742 Personal history of other diseases of the female genital tract: Secondary | ICD-10-CM | POA: Insufficient documentation

## 2013-10-19 DIAGNOSIS — Z792 Long term (current) use of antibiotics: Secondary | ICD-10-CM | POA: Insufficient documentation

## 2013-10-19 DIAGNOSIS — IMO0002 Reserved for concepts with insufficient information to code with codable children: Secondary | ICD-10-CM | POA: Insufficient documentation

## 2013-10-19 DIAGNOSIS — D259 Leiomyoma of uterus, unspecified: Secondary | ICD-10-CM | POA: Insufficient documentation

## 2013-10-19 MED ORDER — SODIUM CHLORIDE 0.9 % IV BOLUS (SEPSIS)
1000.0000 mL | Freq: Once | INTRAVENOUS | Status: AC
  Administered 2013-10-20: 1000 mL via INTRAVENOUS

## 2013-10-19 MED ORDER — ACETAMINOPHEN 325 MG PO TABS
650.0000 mg | ORAL_TABLET | Freq: Once | ORAL | Status: AC
  Administered 2013-10-20: 650 mg via ORAL
  Filled 2013-10-19: qty 2

## 2013-10-19 NOTE — ED Notes (Signed)
Translator (family member) reports nausea and generalized weakness for over a week.  Pt seen in  ED on 11/24- taking meds without relief.  Denies pain.  Denies vomiting.

## 2013-10-20 ENCOUNTER — Other Ambulatory Visit: Payer: Self-pay

## 2013-10-20 ENCOUNTER — Emergency Department (HOSPITAL_COMMUNITY): Payer: Self-pay

## 2013-10-20 LAB — URINALYSIS, ROUTINE W REFLEX MICROSCOPIC
Bilirubin Urine: NEGATIVE
Ketones, ur: NEGATIVE mg/dL
Nitrite: NEGATIVE
Protein, ur: NEGATIVE mg/dL
Specific Gravity, Urine: 1.008 (ref 1.005–1.030)
Urobilinogen, UA: 0.2 mg/dL (ref 0.0–1.0)

## 2013-10-20 LAB — COMPREHENSIVE METABOLIC PANEL
ALT: 10 U/L (ref 0–35)
AST: 12 U/L (ref 0–37)
Alkaline Phosphatase: 95 U/L (ref 39–117)
CO2: 24 mEq/L (ref 19–32)
Calcium: 9.5 mg/dL (ref 8.4–10.5)
Chloride: 98 mEq/L (ref 96–112)
Creatinine, Ser: 0.77 mg/dL (ref 0.50–1.10)
GFR calc non Af Amer: >90 mL/min (ref >90)
Glucose, Bld: 131 mg/dL — ABNORMAL HIGH (ref 70–99)
Potassium: 3.8 mEq/L (ref 3.5–5.1)
Sodium: 135 mEq/L (ref 135–145)
Total Bilirubin: 0.3 mg/dL (ref 0.3–1.2)

## 2013-10-20 LAB — CBC WITH DIFFERENTIAL/PLATELET
Basophils Absolute: 0 10*3/uL (ref 0.0–0.1)
HCT: 36.1 % (ref 36.0–46.0)
Hemoglobin: 11.3 g/dL — ABNORMAL LOW (ref 12.0–15.0)
Lymphocytes Relative: 13 % (ref 12–46)
Monocytes Absolute: 0.4 10*3/uL (ref 0.1–1.0)
Neutro Abs: 10.3 10*3/uL — ABNORMAL HIGH (ref 1.7–7.7)
Platelets: 814 10*3/uL — ABNORMAL HIGH (ref 150–400)
RDW: 16.8 % — ABNORMAL HIGH (ref 11.5–15.5)
WBC: 12.3 10*3/uL — ABNORMAL HIGH (ref 4.0–10.5)

## 2013-10-20 LAB — URINE MICROSCOPIC-ADD ON

## 2013-10-20 LAB — TSH: TSH: 1.521 u[IU]/mL (ref 0.350–4.500)

## 2013-10-20 MED ORDER — PANTOPRAZOLE SODIUM 40 MG IV SOLR
40.0000 mg | Freq: Once | INTRAVENOUS | Status: AC
  Administered 2013-10-20: 40 mg via INTRAVENOUS
  Filled 2013-10-20: qty 40

## 2013-10-20 MED ORDER — ONDANSETRON 8 MG PO TBDP: ORAL_TABLET | ORAL | Status: AC

## 2013-10-20 MED ORDER — ONDANSETRON HCL 4 MG/2ML IJ SOLN
4.0000 mg | Freq: Once | INTRAMUSCULAR | Status: AC
  Administered 2013-10-20: 4 mg via INTRAVENOUS
  Filled 2013-10-20: qty 2

## 2013-10-20 MED ORDER — IOHEXOL 300 MG/ML  SOLN
100.0000 mL | Freq: Once | INTRAMUSCULAR | Status: AC | PRN
  Administered 2013-10-20: 100 mL via INTRAVENOUS

## 2013-10-20 MED ORDER — IOHEXOL 300 MG/ML  SOLN
20.0000 mL | INTRAMUSCULAR | Status: AC
  Administered 2013-10-20: 25 mL via ORAL

## 2013-10-20 NOTE — ED Notes (Signed)
Family sitting with the pt.  Med given .  Iv infusing as ordered

## 2013-10-20 NOTE — ED Notes (Signed)
Pt waiting  For c-t

## 2013-10-20 NOTE — ED Provider Notes (Signed)
CSN: [id number]     Arrival date & time 10/19/13  2310 History   First MD Initiated Contact with Patient 10/20/13 0002     Chief Complaint  Patient presents with  . Weakness  . Nausea   HPI  History provided by the patient and family. Patient is a 47 year old Hispanic female who presents with general weakness and nausea symptoms. Patient has been feeling increased weakness nausea and decreased appetite for the past one week. Symptoms have been associated with occasional abdominal pains and cramps. She was seen in the emergency department for similar symptoms and also reported having diarrhea. Her diarrhea symptoms have improved since last week. She however has continued to have lack of appetite. She also reports having significant weight loss over this time. She denies having any episodes of vomiting. Symptoms are associated with some chills at home but denies any fever. No other aggravating or alleviating factors. No other associated symptoms.   Past Medical History  Diagnosis Date  . DUB (dysfunctional uterine bleeding)   . Abnormal Pap smear and cervical HPV (human papillomavirus)    Past Surgical History  Procedure Laterality Date  . Endometrial ablation     Family History  Problem Relation Age of Onset  . Diabetes Sister   . Thyroid disease Sister   . Diabetes Brother    History  Substance Use Topics  . Smoking status: Never Smoker   . Smokeless tobacco: Not on file  . Alcohol Use: No   OB History   Grav Para Term Preterm Abortions TAB SAB Ect Mult Living   4 3 3       3      Review of Systems  Constitutional: Positive for chills, appetite change, fatigue and unexpected weight change. Negative for fever.  Gastrointestinal: Positive for nausea. Negative for vomiting and diarrhea.  Genitourinary: Positive for vaginal bleeding (Spotting yesterday). Negative for dysuria, frequency, hematuria, flank pain and vaginal discharge.  All other systems reviewed and are  negative.    Allergies  Review of patient's allergies indicates no known allergies.  Home Medications   Current Outpatient Rx  Name  Route  Sig  Dispense  Refill  . amoxicillin (AMOXIL) 500 MG capsule   Oral   Take 500 mg by mouth 2 (two) times daily.         . clarithromycin (BIAXIN) 500 MG tablet   Oral   Take 500 mg by mouth 2 (two) times daily.         . ferrous sulfate 325 (65 FE) MG tablet   Oral   Take 325 mg by mouth daily.           . folic acid (FOLVITE) 1 MG tablet   Oral   Take 1 mg by mouth daily.         Marland Kitchen levothyroxine (SYNTHROID, LEVOTHROID) 25 MCG tablet   Oral   Take 25 mcg by mouth daily before breakfast.         . lisinopril (PRINIVIL,ZESTRIL) 20 MG tablet   Oral   Take 20 mg by mouth daily.         Marland Kitchen omeprazole (PRILOSEC) 20 MG capsule   Oral   Take 20 mg by mouth daily.         . ondansetron (ZOFRAN ODT) 8 MG disintegrating tablet   Oral   Take 1 tablet (8 mg total) by mouth every 8 (eight) hours as needed for nausea or vomiting.   20 tablet   0   .  predniSONE (DELTASONE) 10 MG tablet   Oral   Take 10 mg by mouth daily with breakfast.         . sertraline (ZOLOFT) 50 MG tablet   Oral   Take 50 mg by mouth daily.         Marland Kitchen sulfaSALAzine (AZULFIDINE) 500 MG tablet   Oral   Take 500 mg by mouth 4 (four) times daily.         . traMADol (ULTRAM) 50 MG tablet   Oral   Take 50 mg by mouth every 6 (six) hours as needed.          BP 151/79  Pulse 103  Temp(Src) 98.6 F (37 C) (Oral)  Resp 20  SpO2 100% Physical Exam  Nursing note and vitals reviewed. Constitutional: She is oriented to person, place, and time. She appears well-developed and well-nourished. No distress.  HENT:  Head: Normocephalic.  Cardiovascular: Normal rate and regular rhythm.   No murmur heard. Pulmonary/Chest: Effort normal and breath sounds normal.  Abdominal: Soft. There is tenderness in the left lower quadrant.  Pain is mild   Musculoskeletal: Normal range of motion. She exhibits no edema and no tenderness.  Neurological: She is alert and oriented to person, place, and time.  Skin: Skin is warm and dry. No rash noted.  Psychiatric: She has a normal mood and affect. Her behavior is normal.    ED Course  Procedures   DIAGNOSTIC STUDIES: Oxygen Saturation is 100% on room air.    COORDINATION OF CARE:  Nursing notes reviewed. Vital signs reviewed. Initial pt interview and examination performed.   1:18 AM-the patient seen and evaluated. Patient well-appearing no acute distress. Discussed further work up plan with pt at bedside, which includes CT abd/pelvis. Pt agrees with plan.  Lab testing without any significant or concerning changes. Patient has similar elevated the platelet count from prior. Her WBC is improved from last week but still slightly elevated. Slight anemia without significant or concerning change. Patient is tolerating by mouth fluids. Patient was also discussed with attending physician. No concerning or emergent finding on her lab testing, chest x-ray, CT scan or EKG to explain her vague symptoms. She no longer has diarrhea symptoms. Heart rate has improved with IV fluids. At this time will recommend patient follows up with PCP. SHe agrees with this plan.     Results for orders placed during the hospital encounter of 10/19/13  CBC WITH DIFFERENTIAL      Result Value Range   WBC 12.3 (*) 4.0 - 10.5 K/uL   RBC 4.26  3.87 - 5.11 MIL/uL   Hemoglobin 11.3 (*) 12.0 - 15.0 g/dL   HCT 16.1  09.6 - 04.5 %   MCV 84.7  78.0 - 100.0 fL   MCH 26.5  26.0 - 34.0 pg   MCHC 31.3  30.0 - 36.0 g/dL   RDW 40.9 (*) 81.1 - 91.4 %   Platelets 814 (*) 150 - 400 K/uL   Neutrophils Relative % 83 (*) 43 - 77 %   Neutro Abs 10.3 (*) 1.7 - 7.7 K/uL   Lymphocytes Relative 13  12 - 46 %   Lymphs Abs 1.6  0.7 - 4.0 K/uL   Monocytes Relative 3  3 - 12 %   Monocytes Absolute 0.4  0.1 - 1.0 K/uL   Eosinophils Relative  0  0 - 5 %   Eosinophils Absolute 0.0  0.0 - 0.7 K/uL   Basophils Relative 0  0 -  1 %   Basophils Absolute 0.0  0.0 - 0.1 K/uL  COMPREHENSIVE METABOLIC PANEL      Result Value Range   Sodium 135  135 - 145 mEq/L   Potassium 3.8  3.5 - 5.1 mEq/L   Chloride 98  96 - 112 mEq/L   CO2 24  19 - 32 mEq/L   Glucose, Bld 131 (*) 70 - 99 mg/dL   BUN 6  6 - 23 mg/dL   Creatinine, Ser 1.61  0.50 - 1.10 mg/dL   Calcium 9.5  8.4 - 09.6 mg/dL   Total Protein 7.8  6.0 - 8.3 g/dL   Albumin 2.8 (*) 3.5 - 5.2 g/dL   AST 12  0 - 37 U/L   ALT 10  0 - 35 U/L   Alkaline Phosphatase 95  39 - 117 U/L   Total Bilirubin 0.3  0.3 - 1.2 mg/dL   GFR calc non Af Amer >90  >90 mL/min   GFR calc Af Amer >90  >90 mL/min  URINALYSIS, ROUTINE W REFLEX MICROSCOPIC      Result Value Range   Color, Urine YELLOW  YELLOW   APPearance HAZY (*) CLEAR   Specific Gravity, Urine 1.008  1.005 - 1.030   pH 7.0  5.0 - 8.0   Glucose, UA NEGATIVE  NEGATIVE mg/dL   Hgb urine dipstick LARGE (*) NEGATIVE   Bilirubin Urine NEGATIVE  NEGATIVE   Ketones, ur NEGATIVE  NEGATIVE mg/dL   Protein, ur NEGATIVE  NEGATIVE mg/dL   Urobilinogen, UA 0.2  0.0 - 1.0 mg/dL   Nitrite NEGATIVE  NEGATIVE   Leukocytes, UA NEGATIVE  NEGATIVE  URINE MICROSCOPIC-ADD ON      Result Value Range   Squamous Epithelial / LPF FEW (*) RARE   WBC, UA 0-2  <3 WBC/hpf   RBC / HPF 21-50  <3 RBC/hpf      Imaging Review Dg Chest 2 View  10/20/2013   CLINICAL DATA:  Weakness, nausea, emesis, dizziness, and syncope for 1 month.  EXAM: CHEST  2 VIEW  COMPARISON:  07/22/2011  FINDINGS: The heart size and mediastinal contours are within normal limits. Both lungs are clear. The visualized skeletal structures are unremarkable.  IMPRESSION: No active cardiopulmonary disease.   Electronically Signed   By: Burman Nieves M.D.   On: 10/20/2013 00:56   Ct Abdomen Pelvis W Contrast  10/20/2013   CLINICAL DATA:  Nausea in generalized weakness for over a week.  EXAM:  CT ABDOMEN AND PELVIS WITH CONTRAST  TECHNIQUE: Multidetector CT imaging of the abdomen and pelvis was performed using the standard protocol following bolus administration of intravenous contrast.  CONTRAST:  OMNIPAQUE IOHEXOL 300 MG/ML  SOLN  COMPARISON:  None.  FINDINGS: Mild dependent atelectasis in the lung bases.  Focal low-attenuation change adjacent to the falciform ligament of the liver likely represents focal fatty infiltration. No other focal liver lesions demonstrated. The spleen, gallbladder, pancreas, adrenal glands, kidneys, abdominal aorta, inferior vena cava, and retroperitoneal lymph nodes are unremarkable. The stomach, small bowel, and colon are not abnormally distended. No wall thickening, allowing for limited distention. No free air or free fluid in the abdomen. Small accessory spleen.  Pelvis: Lobular appearance of the uterus suggesting myometrial leiomyomas. Ovaries are not enlarged. Bladder wall is not thickened. No free or loculated pelvic fluid collections. No significant pelvic lymphadenopathy. The appendix is normal. On degenerative changes in the lumbar spine. No destructive bone lesions appreciated.  IMPRESSION: No acute process demonstrated  in the abdomen or pelvis.   Electronically Signed   By: Burman Nieves M.D.   On: 10/20/2013 06:02    EKG Interpretation   None      Date: 10/20/2013  Rate: 94  Rhythm: normal sinus rhythm  QRS Axis: normal  Intervals: PR prolonged  ST/T Wave abnormalities: nonspecific T wave changes  Conduction Disutrbances:none  Narrative Interpretation:   Old EKG Reviewed: unchanged    MDM   1. Nausea   2. Fatigue   3. Leiomyoma        Angus Seller, PA-C 10/20/13 2059

## 2013-10-20 NOTE — ED Provider Notes (Signed)
Medical screening examination/treatment/procedure(s) were performed by non-physician practitioner and as supervising physician I was immediately available for consultation/collaboration.    Sunnie Nielsen, MD 10/20/13 [phone]

## 2013-10-20 NOTE — ED Notes (Signed)
c-t called pt through drinking oral contrast

## 2014-09-23 ENCOUNTER — Encounter (HOSPITAL_COMMUNITY): Payer: Self-pay | Admitting: Emergency Medicine

## 2016-04-28 ENCOUNTER — Other Ambulatory Visit: Payer: Self-pay | Admitting: Nephrology

## 2016-04-28 DIAGNOSIS — N2581 Secondary hyperparathyroidism of renal origin: Secondary | ICD-10-CM

## 2016-05-07 ENCOUNTER — Inpatient Hospital Stay: Admission: RE | Admit: 2016-05-07 | Payer: Self-pay | Source: Ambulatory Visit

## 2016-05-28 ENCOUNTER — Other Ambulatory Visit: Payer: Self-pay

## 2016-05-31 ENCOUNTER — Ambulatory Visit
Admission: RE | Admit: 2016-05-31 | Discharge: 2016-05-31 | Disposition: A | Discharge status: Home / Self Care | Payer: No Typology Code available for payment source | Source: Ambulatory Visit | Attending: Nephrology | Admitting: Nephrology

## 2016-05-31 DIAGNOSIS — N2581 Secondary hyperparathyroidism of renal origin: Secondary | ICD-10-CM

## 2019-06-26 ENCOUNTER — Encounter (HOSPITAL_COMMUNITY): Payer: Self-pay | Admitting: Emergency Medicine

## 2019-06-26 ENCOUNTER — Ambulatory Visit (HOSPITAL_COMMUNITY)
Admission: EM | Admit: 2019-06-26 | Discharge: 2019-06-26 | Disposition: A | Discharge status: Home / Self Care | Payer: HRSA Program | Attending: Emergency Medicine | Admitting: Emergency Medicine

## 2019-06-26 DIAGNOSIS — Z79899 Other long term (current) drug therapy: Secondary | ICD-10-CM | POA: Diagnosis not present

## 2019-06-26 DIAGNOSIS — D649 Anemia, unspecified: Secondary | ICD-10-CM | POA: Insufficient documentation

## 2019-06-26 DIAGNOSIS — Z79890 Hormone replacement therapy (postmenopausal): Secondary | ICD-10-CM | POA: Insufficient documentation

## 2019-06-26 DIAGNOSIS — M069 Rheumatoid arthritis, unspecified: Secondary | ICD-10-CM | POA: Insufficient documentation

## 2019-06-26 DIAGNOSIS — Z833 Family history of diabetes mellitus: Secondary | ICD-10-CM | POA: Diagnosis not present

## 2019-06-26 DIAGNOSIS — Z20822 Contact with and (suspected) exposure to covid-19: Secondary | ICD-10-CM

## 2019-06-26 DIAGNOSIS — Z20828 Contact with and (suspected) exposure to other viral communicable diseases: Secondary | ICD-10-CM | POA: Diagnosis not present

## 2019-06-26 DIAGNOSIS — Z792 Long term (current) use of antibiotics: Secondary | ICD-10-CM | POA: Diagnosis not present

## 2019-06-26 DIAGNOSIS — Z7952 Long term (current) use of systemic steroids: Secondary | ICD-10-CM | POA: Diagnosis not present

## 2019-06-26 NOTE — ED Provider Notes (Signed)
[facility]    CSN: [id number] Arrival date & time: 06/26/19  1659     History   Chief Complaint Chief Complaint  Patient presents with  . Covid Exposure    HPI [name] is a 53 y.o. female.  Patient presents with request for COVID test today.  She denies symptoms at this time including cough, shortness of breath, fever, or diarrhea.  She states her husband tested positive for COVID.   The history is provided by the patient. A language interpreter was used.    Past Medical History:  Diagnosis Date  . Abnormal Pap smear and cervical HPV (human papillomavirus)   . DUB (dysfunctional uterine bleeding)     Patient Active Problem List   Diagnosis Date Noted  . 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 ANEMIA-NOS 03/31/2009  . RHEUMATOID ARTHRITIS 03/31/2009    Past Surgical History:  Procedure Laterality Date  . ENDOMETRIAL ABLATION      OB History    Gravida  4   Para  3   Term  3   Preterm      AB      Living  3     SAB      TAB      Ectopic      Multiple      Live Births               Home Medications    Prior to Admission medications   Medication Sig Start Date End Date Taking? Authorizing Provider  amoxicillin (AMOXIL) 500 MG capsule Take 500 mg by mouth 2 (two) times daily.    [provider]  clarithromycin (BIAXIN) 500 MG tablet Take 500 mg by mouth 2 (two) times daily.    [provider]  ferrous sulfate 325 (65 FE) MG tablet Take 325 mg by mouth daily.      [provider]  folic acid (FOLVITE) 1 MG tablet Take 1 mg by mouth daily.    [provider]  levothyroxine (SYNTHROID, LEVOTHROID) 25 MCG tablet Take 25 mcg by mouth daily before breakfast.    [provider]  lisinopril (PRINIVIL,ZESTRIL) 20 MG tablet Take 20 mg by mouth daily.    [provider]  methotrexate 25 MG/ML SOLN Inject 17.5 mg into the muscle once a week. On wednesday    [provider]   omeprazole (PRILOSEC) 20 MG capsule Take 20 mg by mouth daily.    [provider]  ondansetron (ZOFRAN ODT) 8 MG disintegrating tablet 8mg  ODT q4 hours prn nausea 10/20/13   Hazel Sams, PA-C  ondansetron (ZOFRAN) 4 MG tablet Take 8 mg by mouth every 8 (eight) hours as needed for nausea or vomiting.    [provider]  predniSONE (DELTASONE) 10 MG tablet Take 10 mg by mouth daily with breakfast.    [provider]  sertraline (ZOLOFT) 50 MG tablet Take 50 mg by mouth daily.    [provider]  sulfaSALAzine (AZULFIDINE) 500 MG tablet Take 1,000 mg by mouth 2 (two) times daily.     [provider]  traMADol (ULTRAM) 50 MG tablet Take 50 mg by mouth every 6 (six) hours as needed.    [provider]    Family History Family History  Problem Relation Age of Onset  . Diabetes Sister   . Thyroid disease Sister   . Diabetes Brother     Social History Social History   Tobacco Use  . Smoking status:  Never Smoker  Substance Use Topics  . Alcohol use: No  . Drug use: No     Allergies   Patient has no known allergies.   Review of Systems Review of Systems  Constitutional: Negative for chills and fever.  HENT: Negative for congestion, ear pain, rhinorrhea and sore throat.   Eyes: Negative for pain and visual disturbance.  Respiratory: Negative for cough and shortness of breath.   Cardiovascular: Negative for chest pain and palpitations.  Gastrointestinal: Negative for abdominal pain, diarrhea and vomiting.  Genitourinary: Negative for dysuria and hematuria.  Musculoskeletal: Negative for arthralgias and back pain.  Skin: Negative for color change and rash.  Neurological: Negative for seizures and syncope.  All other systems reviewed and are negative.    Physical Exam Triage Vital Signs ED Triage Vitals [06/26/19 1725]  Enc Vitals Group     BP (!) 141/78     Pulse Rate 72     Resp 18     Temp 98.5 F (36.9 C)     Temp  src      SpO2 100 %     Weight      Height      Head Circumference      Peak Flow      Pain Score 0     Pain Loc      Pain Edu?      Excl. in [place]?    No data found.  Updated Vital Signs BP (!) 141/78   Pulse 72   Temp 98.5 F (36.9 C)   Resp 18   SpO2 100%   Visual Acuity Right Eye Distance:   Left Eye Distance:   Bilateral Distance:    Right Eye Near:   Left Eye Near:    Bilateral Near:     Physical Exam Vitals signs and nursing note reviewed.  Constitutional:      General: She is not in acute distress.    Appearance: She is well-developed.  HENT:     Head: Normocephalic and atraumatic.     Right Ear: Tympanic membrane normal.     Left Ear: Tympanic membrane and ear canal normal.     Nose: Nose normal.     Mouth/Throat:     Mouth: Mucous membranes are moist.     Pharynx: Oropharynx is clear.  Eyes:     Conjunctiva/sclera: Conjunctivae normal.  Neck:     Musculoskeletal: Neck supple.  Cardiovascular:     Rate and Rhythm: Normal rate and regular rhythm.     Heart sounds: No murmur.  Pulmonary:     Effort: Pulmonary effort is normal. No respiratory distress.     Breath sounds: Normal breath sounds.  Abdominal:     Palpations: Abdomen is soft.     Tenderness: There is no abdominal tenderness. There is no guarding or rebound.  Skin:    General: Skin is warm and dry.     Findings: No rash.  Neurological:     Mental Status: She is alert.      UC Treatments / Results  Labs (all labs ordered are listed, but only abnormal results are displayed) Labs Reviewed  NOVEL CORONAVIRUS, NAA (HOSPITAL ORDER, SEND-OUT TO REF LAB)    EKG   Radiology No results found.  Procedures Procedures (including critical care time)  Medications Ordered in UC Medications - No data to display  Initial Impression / Assessment and Plan / UC Course  I have reviewed the triage vital signs and the  nursing notes.  Pertinent labs & imaging results that were available  during my care of the patient were reviewed by me and considered in my medical decision making (see chart for details).   Suspect COVID.  Patient is asymptomatic but reports her husband is currently COVID positive.  COVID test performed here per patient request.  Discussed with patient that she should self quarantine until her test result is back and negative.  Instructed patient to go to the emergency department if she develops shortness of breath, high fever, severe diarrhea, or other concerning symptoms.     Final Clinical Impressions(s) / UC Diagnoses   Final diagnoses:  Suspected Covid-19 Virus Infection     Discharge Instructions     Your COVID test is pending.  You should self quarantine until your test result is back and is negative.    Go to the emergency department if you develop shortness of breath, high fever, severe diarrhea, or other concerning symptoms.         ED Prescriptions    None     Controlled Substance Prescriptions [place] Controlled Substance Registry consulted? Not Applicable   Sharion Balloon, NP 06/26/19 [phone]

## 2019-06-26 NOTE — Discharge Instructions (Addendum)
Your COVID test is pending.  You should self quarantine until your test result is back and is negative.   ° °Go to the emergency department if you develop shortness of breath, high fever, severe diarrhea, or other concerning symptoms.   ° ° ° °

## 2019-06-26 NOTE — ED Triage Notes (Signed)
Pt states her husband tested positive for covid and was told to come in for testing. Pt denies symptoms.

## 2019-06-28 LAB — NOVEL CORONAVIRUS, NAA (HOSP ORDER, SEND-OUT TO REF LAB; TAT 18-24 HRS): SARS-CoV-2, NAA: NOT DETECTED

## 2020-04-10 ENCOUNTER — Ambulatory Visit: Payer: No Typology Code available for payment source | Admitting: Family Medicine

## 2020-05-12 ENCOUNTER — Ambulatory Visit: Payer: No Typology Code available for payment source | Admitting: Family Medicine

## 2020-06-06 ENCOUNTER — Ambulatory Visit: Payer: No Typology Code available for payment source | Admitting: Internal Medicine

## 2022-09-02 ENCOUNTER — Encounter (HOSPITAL_BASED_OUTPATIENT_CLINIC_OR_DEPARTMENT_OTHER): Payer: Self-pay

## 2022-09-02 ENCOUNTER — Other Ambulatory Visit: Payer: Self-pay

## 2022-09-02 DIAGNOSIS — Z79899 Other long term (current) drug therapy: Secondary | ICD-10-CM | POA: Insufficient documentation

## 2022-09-02 DIAGNOSIS — R109 Unspecified abdominal pain: Secondary | ICD-10-CM | POA: Insufficient documentation

## 2022-09-02 LAB — CBC
HCT: 35.7 % — ABNORMAL LOW (ref 36.0–46.0)
Hemoglobin: 11.8 g/dL — ABNORMAL LOW (ref 12.0–15.0)
MCH: 30.4 pg (ref 26.0–34.0)
MCHC: 33.1 g/dL (ref 30.0–36.0)
MCV: 92 fL (ref 80.0–100.0)
Platelets: 415 10*3/uL — ABNORMAL HIGH (ref 150–400)
RBC: 3.88 MIL/uL (ref 3.87–5.11)
RDW: 12.2 % (ref 11.5–15.5)
WBC: 9.6 10*3/uL (ref 4.0–10.5)
nRBC: 0 % (ref 0.0–0.2)

## 2022-09-02 LAB — URINALYSIS, ROUTINE W REFLEX MICROSCOPIC
Bilirubin Urine: NEGATIVE
Glucose, UA: NEGATIVE mg/dL
Hgb urine dipstick: NEGATIVE
Ketones, ur: NEGATIVE mg/dL
Leukocytes,Ua: NEGATIVE
Nitrite: NEGATIVE
Protein, ur: NEGATIVE mg/dL
Specific Gravity, Urine: 1.009 (ref 1.005–1.030)
pH: 5 (ref 5.0–8.0)

## 2022-09-02 LAB — BASIC METABOLIC PANEL
Anion gap: 10 (ref 5–15)
BUN: 33 mg/dL — ABNORMAL HIGH (ref 6–20)
CO2: 24 mmol/L (ref 22–32)
Calcium: 9.6 mg/dL (ref 8.9–10.3)
Chloride: 100 mmol/L (ref 98–111)
Creatinine, Ser: 1.95 mg/dL — ABNORMAL HIGH (ref 0.44–1.00)
GFR, Estimated: 29 mL/min — ABNORMAL LOW (ref >60)
Glucose, Bld: 127 mg/dL — ABNORMAL HIGH (ref 70–99)
Potassium: 4.3 mmol/L (ref 3.5–5.1)
Sodium: 134 mmol/L — ABNORMAL LOW (ref 135–145)

## 2022-09-02 NOTE — ED Triage Notes (Signed)
Patient here POV from Home.  Endorses Left Sided Flank Pain for approximately 6 Days. Constant and Consistent.   Some nausea. No Emesis. No Fevers. No Dysuria.   NAD Noted during Triage. A&Ox4. GCS 15. BIB Wheelchair.

## 2022-09-03 ENCOUNTER — Emergency Department (HOSPITAL_BASED_OUTPATIENT_CLINIC_OR_DEPARTMENT_OTHER)
Admission: EM | Admit: 2022-09-03 | Discharge: 2022-09-03 | Disposition: A | Discharge status: Home / Self Care | Payer: No Typology Code available for payment source | Attending: Emergency Medicine | Admitting: Emergency Medicine

## 2022-09-03 ENCOUNTER — Emergency Department (HOSPITAL_BASED_OUTPATIENT_CLINIC_OR_DEPARTMENT_OTHER): Payer: No Typology Code available for payment source

## 2022-09-03 ENCOUNTER — Encounter (HOSPITAL_BASED_OUTPATIENT_CLINIC_OR_DEPARTMENT_OTHER): Payer: Self-pay | Admitting: Emergency Medicine

## 2022-09-03 DIAGNOSIS — R109 Unspecified abdominal pain: Secondary | ICD-10-CM

## 2022-09-03 HISTORY — DX: Disorder of kidney and ureter, unspecified: N28.9

## 2022-09-03 MED ORDER — HYDROCODONE-ACETAMINOPHEN 5-325 MG PO TABS: 1.0000 | ORAL_TABLET | Freq: Four times a day (QID) | ORAL | 0 refills | Status: AC | PRN

## 2022-09-03 NOTE — ED Provider Notes (Signed)
[facility] Provider Note: Georgena Spurling, MD, FACEP  CSN: [id number] MRN: [id number] ARRIVAL: 09/02/22 at 2123 ROOM: [place]  Flank Pain  Professional telemetry interpreter used HISTORY OF PRESENT ILLNESS  09/03/22 4:16 AM [name] is a 56 y.o. female with known renal insufficiency being followed at [facility].  She is here with left flank pain for approximately 6 days.  The flank pain is lower than the location of pain usually associated with kidney stones.  It is worse with ambulation or with movement of her lower back.  It is severe enough that it is making walking difficult.  She is not aware of any precipitating trauma.  She has had occasional nausea but no vomiting.  She denies any fever, chills, bowel or bladder changes.  She describes the pain as a deep pain.   Past Medical History:  Diagnosis Date   Abnormal Pap smear and cervical HPV (human papillomavirus)    DUB (dysfunctional uterine bleeding)    Renal insufficiency     Past Surgical History:  Procedure Laterality Date   ENDOMETRIAL ABLATION      Family History  Problem Relation Age of Onset   Diabetes Sister    Thyroid disease Sister    Diabetes Brother     Social History   Tobacco Use   Smoking status: Never  Substance Use Topics   Alcohol use: No   Drug use: No    Prior to Admission medications   Medication Sig Start Date End Date Taking? Authorizing Provider  HYDROcodone-acetaminophen (NORCO) 5-325 MG tablet Take 1 tablet by mouth every 6 (six) hours as needed for severe pain (May cause constipation.). 09/03/22  Yes Magalie Almon, MD  amoxicillin (AMOXIL) 500 MG capsule Take 500 mg by mouth 2 (two) times daily.    [provider]  clarithromycin (BIAXIN) 500 MG tablet Take 500 mg by mouth 2 (two) times daily.    [provider]  ferrous sulfate 325 (65 FE) MG tablet Take 325 mg by mouth daily.      [provider]  folic acid  (FOLVITE) 1 MG tablet Take 1 mg by mouth daily.    [provider]  levothyroxine (SYNTHROID, LEVOTHROID) 25 MCG tablet Take 25 mcg by mouth daily before breakfast.    [provider]  lisinopril (PRINIVIL,ZESTRIL) 20 MG tablet Take 20 mg by mouth daily.    [provider]  methotrexate 25 MG/ML SOLN Inject 17.5 mg into the muscle once a week. On wednesday    [provider]  omeprazole (PRILOSEC) 20 MG capsule Take 20 mg by mouth daily.    [provider]  ondansetron (ZOFRAN ODT) 8 MG disintegrating tablet '8mg'$  ODT q4 hours prn nausea 10/20/13   Hazel Sams, PA-C  ondansetron (ZOFRAN) 4 MG tablet Take 8 mg by mouth every 8 (eight) hours as needed for nausea or vomiting.    [provider]  predniSONE (DELTASONE) 10 MG tablet Take 10 mg by mouth daily with breakfast.    [provider]  sertraline (ZOLOFT) 50 MG tablet Take 50 mg by mouth daily.    [provider]  sulfaSALAzine (AZULFIDINE) 500 MG tablet Take 1,000 mg by mouth 2 (two) times daily.     [provider]    Allergies Patient has no known allergies.   REVIEW OF SYSTEMS  Negative except as noted here or in the History of Present Illness.   PHYSICAL EXAMINATION  Initial Vital  Signs Blood pressure (!) 160/75, pulse 78, temperature 98.2 F (36.8 C), temperature source Oral, resp. rate 16, SpO2 100 %.  Examination General: Well-developed, well-nourished female in no acute distress; appearance consistent with age of record HENT: normocephalic; atraumatic Eyes: Normal appearance Neck: supple Heart: regular rate and rhythm Lungs: clear to auscultation bilaterally Abdomen: soft; nondistended; nontender; bowel sounds present Back: Pain on movement of lower back; no pain on palpation of left flank; no rash seen Extremities: No deformity; full range of motion; pulses normal Neurologic: Awake, alert and oriented; motor function intact in all  extremities and symmetric; no facial droop Skin: Warm and dry Psychiatric: Normal mood and affect   RESULTS  Summary of this visit's results, reviewed and interpreted by myself:   EKG Interpretation  Date/Time:    Ventricular Rate:    PR Interval:    QRS Duration:   QT Interval:    QTC Calculation:   R Axis:     Text Interpretation:         Laboratory Studies: Results for orders placed or performed during the hospital encounter of 09/03/22 (from the past 24 hour(s))  Urinalysis, Routine w reflex microscopic Urine, Clean Catch     Status: None   Collection Time: 09/02/22  9:45 PM  Result Value Ref Range   Color, Urine YELLOW YELLOW   APPearance CLEAR CLEAR   Specific Gravity, Urine 1.009 1.005 - 1.030   pH 5.0 5.0 - 8.0   Glucose, UA NEGATIVE NEGATIVE mg/dL   Hgb urine dipstick NEGATIVE NEGATIVE   Bilirubin Urine NEGATIVE NEGATIVE   Ketones, ur NEGATIVE NEGATIVE mg/dL   Protein, ur NEGATIVE NEGATIVE mg/dL   Nitrite NEGATIVE NEGATIVE   Leukocytes,Ua NEGATIVE NEGATIVE  CBC     Status: Abnormal   Collection Time: 09/02/22  9:45 PM  Result Value Ref Range   WBC 9.6 4.0 - 10.5 K/uL   RBC 3.88 3.87 - 5.11 MIL/uL   Hemoglobin 11.8 (L) 12.0 - 15.0 g/dL   HCT 35.7 (L) 36.0 - 46.0 %   MCV 92.0 80.0 - 100.0 fL   MCH 30.4 26.0 - 34.0 pg   MCHC 33.1 30.0 - 36.0 g/dL   RDW 12.2 11.5 - 15.5 %   Platelets 415 (H) 150 - 400 K/uL   nRBC 0.0 0.0 - 0.2 %  Basic metabolic panel     Status: Abnormal   Collection Time: 09/02/22  9:45 PM  Result Value Ref Range   Sodium 134 (L) 135 - 145 mmol/L   Potassium 4.3 3.5 - 5.1 mmol/L   Chloride 100 98 - 111 mmol/L   CO2 24 22 - 32 mmol/L   Glucose, Bld 127 (H) 70 - 99 mg/dL   BUN 33 (H) 6 - 20 mg/dL   Creatinine, Ser 1.95 (H) 0.44 - 1.00 mg/dL   Calcium 9.6 8.9 - 10.3 mg/dL   GFR, Estimated 29 (L) >60 mL/min   Anion gap 10 5 - 15   Imaging Studies: CT Renal Stone Study  Result Date: 09/03/2022 CLINICAL DATA:  57 year old female  with history of left-sided flank pain for the past 6 days. EXAM: CT ABDOMEN AND PELVIS WITHOUT CONTRAST TECHNIQUE: Multidetector CT imaging of the abdomen and pelvis was performed following the standard protocol without IV contrast. RADIATION DOSE REDUCTION: This exam was performed according to the departmental dose-optimization program which includes automated exposure control, adjustment of the mA and/or kV according to patient size and/or use of iterative reconstruction technique. COMPARISON:  CT of the  abdomen and pelvis 10/20/2013. FINDINGS: Lower chest: Unremarkable. Hepatobiliary: 1 cm low-attenuation lesion between segments 7 and 8 in the liver (axial image 11 of series 2), incompletely characterized on today's noncontrast CT examination, but statistically likely small cysts. No other definite suspicious appearing hepatic lesions are noted on today's noncontrast CT examination. Unenhanced appearance of the gallbladder is normal. Pancreas: No definite pancreatic mass or peripancreatic fluid collections or inflammatory changes are noted on today's noncontrast CT examination. Spleen: Unremarkable. Adrenals/Urinary Tract: There are no abnormal calcifications within the collecting system of either kidney, along the course of either ureter, or within the lumen of the urinary bladder. No hydroureteronephrosis or perinephric stranding to suggest urinary tract obstruction at this time. The unenhanced appearance of the kidneys is unremarkable bilaterally. Urinary bladder is moderately distended, but otherwise unremarkable in appearance. Bilateral adrenal glands are normal in appearance. Stomach/Bowel: Unenhanced appearance of the stomach is normal. There is no pathologic dilatation of small bowel or colon. Normal appendix. Vascular/Lymphatic: Minimal atherosclerotic calcifications in the pelvic vasculature. No lymphadenopathy noted in the abdomen or pelvis. Reproductive: Uterus and ovaries are unremarkable in  appearance. Other: No significant volume of ascites.  No pneumoperitoneum. Musculoskeletal: There are no aggressive appearing lytic or blastic lesions noted in the visualized portions of the skeleton. IMPRESSION: 1. No acute findings in the abdomen or pelvis to account for the patient's symptoms. Specifically, no urinary tract calculi no findings of urinary tract obstruction. Electronically Signed   By: Vinnie Langton M.D.   On: 09/03/2022 05:09    ED COURSE and MDM  Nursing notes, initial and subsequent vitals signs, including pulse oximetry, reviewed and interpreted by myself.  Vitals:   09/02/22 2139 09/03/22 0209 09/03/22 0400 09/03/22 0445  BP: (!) 161/84 (!) 160/75 (!) 155/74 (!) 149/68  Pulse: 76 78 85 75  Resp: '16 16 18 18  '$ Temp: 98.3 F (36.8 C) 98.2 F (36.8 C)    TempSrc:  Oral    SpO2: 100% 100% 96% 100%   Medications - No data to display  No intra-abdominal pathology was found on CT to explain the patient's pain.  I suspect it is musculoskeletal or radicular in nature given its exacerbation with walking or movement.  It is not reproducible with palpation and there is no rash to suggest shingles.  We will treat with a short course of analgesics and refer to her primary care physician.  PROCEDURES  Procedures   ED DIAGNOSES     ICD-10-CM   1. Acute left flank pain  R10.9          Desirey Keahey, Jenny Reichmann, MD 09/03/22 [phone]
